# Patient Record
Sex: Female | Born: 1995 | Race: White | Hispanic: No | Marital: Single | Smoking: Current every day smoker
Health system: Southern US, Community
[De-identification: ages and names within clinical notes are randomized; demographics above are authoritative.]

## PROBLEM LIST (undated history)

## (undated) DIAGNOSIS — R55 Syncope and collapse: Secondary | ICD-10-CM

## (undated) DIAGNOSIS — G43909 Migraine, unspecified, not intractable, without status migrainosus: Secondary | ICD-10-CM

## (undated) DIAGNOSIS — R251 Tremor, unspecified: Secondary | ICD-10-CM

## (undated) DIAGNOSIS — K648 Other hemorrhoids: Secondary | ICD-10-CM

## (undated) DIAGNOSIS — J45909 Unspecified asthma, uncomplicated: Secondary | ICD-10-CM

## (undated) HISTORY — DX: Other hemorrhoids: K64.8

---
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
## 2017-07-10 ENCOUNTER — Emergency Department
Admission: EM | Admit: 2017-07-10 | Discharge: 2017-07-10 | Disposition: A | Discharge status: Home / Self Care | Payer: 59 | Attending: Emergency Medicine | Admitting: Emergency Medicine

## 2017-07-10 ENCOUNTER — Encounter: Payer: Self-pay | Admitting: Emergency Medicine

## 2017-07-10 DIAGNOSIS — J45909 Unspecified asthma, uncomplicated: Secondary | ICD-10-CM | POA: Insufficient documentation

## 2017-07-10 DIAGNOSIS — R42 Dizziness and giddiness: Secondary | ICD-10-CM | POA: Diagnosis not present

## 2017-07-10 DIAGNOSIS — E860 Dehydration: Secondary | ICD-10-CM | POA: Diagnosis not present

## 2017-07-10 DIAGNOSIS — N946 Dysmenorrhea, unspecified: Secondary | ICD-10-CM | POA: Insufficient documentation

## 2017-07-10 DIAGNOSIS — N938 Other specified abnormal uterine and vaginal bleeding: Secondary | ICD-10-CM | POA: Diagnosis present

## 2017-07-10 LAB — COMPREHENSIVE METABOLIC PANEL
ALBUMIN: 4.4 g/dL (ref 3.5–5.0)
ALT: 30 U/L (ref 14–54)
ANION GAP: 6 (ref 5–15)
AST: 33 U/L (ref 15–41)
Alkaline Phosphatase: 86 U/L (ref 38–126)
BILIRUBIN TOTAL: 0.5 mg/dL (ref 0.3–1.2)
BUN: 13 mg/dL (ref 6–20)
CO2: 22 mmol/L (ref 22–32)
Calcium: 9.4 mg/dL (ref 8.9–10.3)
Chloride: 109 mmol/L (ref 101–111)
Creatinine, Ser: 0.67 mg/dL (ref 0.44–1.00)
GFR calc non Af Amer: >60 mL/min (ref >60)
Glucose, Bld: 99 mg/dL (ref 65–99)
POTASSIUM: 4.4 mmol/L (ref 3.5–5.1)
SODIUM: 137 mmol/L (ref 135–145)
Total Protein: 7.5 g/dL (ref 6.5–8.1)

## 2017-07-10 LAB — CBC
HEMATOCRIT: 40.2 % (ref 35.0–47.0)
HEMOGLOBIN: 13.8 g/dL (ref 12.0–16.0)
MCH: 30.1 pg (ref 26.0–34.0)
MCHC: 34.2 g/dL (ref 32.0–36.0)
MCV: 87.9 fL (ref 80.0–100.0)
Platelets: 308 10*3/uL (ref 150–440)
RBC: 4.58 MIL/uL (ref 3.80–5.20)
RDW: 13.9 % (ref 11.5–14.5)
WBC: 7.4 10*3/uL (ref 3.6–11.0)

## 2017-07-10 LAB — LIPASE, BLOOD: Lipase: 23 U/L (ref 11–51)

## 2017-07-10 LAB — HCG, QUANTITATIVE, PREGNANCY: hCG, Beta Chain, Quant, S: <1 m[IU]/mL (ref <5)

## 2017-07-10 NOTE — ED Provider Notes (Signed)
[facility] Department Provider Note  ____________________________________________  Time seen: Approximately 12:57 PM  I have reviewed the triage vital signs and the nursing notes.   HISTORY  Chief Complaint Vaginal Bleeding; Dizziness; and Nausea    HPI Julia Brooks is a 21 y.o. female who comes to the ED complaining of dizziness nausea and generalized weakness. Reports that her period started 5 days ago which is normal time for her, and was heavy on the second and third day but now has improved and is pretty much stopped. 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 Denies any recent illness. No fevers chills or sweats. No cough urinary symptoms or significant pain currently.     Past Medical History:  Diagnosis Date  . Asthma   . Migraine   . Occasional tremors   . Syncope and collapse      Patient Active Problem List   Diagnosis Date Noted  . Near syncope 03/04/2014  . Headache 03/04/2014     No past surgical history on file.   Prior to Admission medications   Medication Sig Start Date End Date Taking? Authorizing Provider  albuterol (PROVENTIL HFA;VENTOLIN HFA) 108 (90 BASE) MCG/ACT inhaler Inhale 2 puffs into the lungs every 6 (six) hours as needed. For shortness of breath    [provider]  cyclobenzaprine (FLEXERIL) 5 MG tablet Take 1-2 tablets (5-10 mg total) by mouth 3 (three) times daily as needed for muscle spasms. 07/14/14   Pricilla LovelessGoldston, Scott, MD  ondansetron (ZOFRAN) 4 MG tablet Take 1 tablet (4 mg total) by mouth every 6 (six) hours. 12/11/12   Jethro Bastosrawford, Anne W, NP  SUMAtriptan (IMITREX) 50 MG tablet Take 50 mg by mouth every 2 (two) hours as needed. For migraine    [provider]  topiramate (TOPAMAX) 25 MG tablet Take 25 mg by mouth at bedtime.    [provider]      Allergies Patient has no known allergies.   No family history on file.  Social History Social History  Substance Use Topics  . Smoking status: Never Smoker  . Smokeless tobacco: Not on file  . Alcohol use No    Review of Systems  Constitutional:   No fever or chills.  ENT:   No sore throat. No rhinorrhea. Cardiovascular:   No chest pain or syncope. Respiratory:   No dyspnea or cough. Gastrointestinal:   Negative for abdominal pain, vomiting and diarrhea.  Musculoskeletal:   Negative for focal pain or swelling All other systems reviewed and are negative except as documented above in ROS and HPI.  ____________________________________________   PHYSICAL EXAM:  VITAL SIGNS: ED Triage Vitals [07/10/17 1039]  Enc Vitals Group     BP 133/78     Pulse Rate 84     Resp 18     Temp 98.4 F (36.9 C)     Temp Source Oral     SpO2 98 %     Weight 185 lb (83.9 kg)     Height 5\' 1"  (1.549 m)     Head Circumference      Peak Flow      Pain Score 5     Pain Loc      Pain Edu?      Excl. in GC?     Vital signs reviewed, nursing assessments reviewed.  Constitutional:   Alert and oriented. Well appearing and in no distress. Eyes:   No scleral icterus.  EOMI. No nystagmus. No conjunctival pallor. PERRL. ENT   Head:   Normocephalic and atraumatic.   Nose:   No congestion/rhinnorhea.    Mouth/Throat:   MMM, no pharyngeal erythema. No peritonsillar mass.    Neck:   No meningismus. Full ROM Hematological/Lymphatic/Immunilogical:   No cervical lymphadenopathy. Cardiovascular:   RRR. Symmetric bilateral radial and DP pulses.  No murmurs.  Respiratory:   Normal respiratory effort without tachypnea/retractions. Breath sounds are clear and equal bilaterally. No wheezes/rales/rhonchi. Gastrointestinal:   Soft and nontender. Non distended. There is no CVA tenderness.  No rebound, rigidity, or guarding. Genitourinary:   deferred Musculoskeletal:   Normal range of  motion in all extremities. No joint effusions.  No lower extremity tenderness.  No edema. Neurologic:   Normal speech and language.  Motor grossly intact. No gross focal neurologic deficits are appreciated.  Skin:    Skin is warm, dry and intact. No rash noted.  No petechiae, purpura, or bullae.  ____________________________________________    LABS (pertinent positives/negatives) (all labs ordered are listed, but only abnormal results are displayed) Labs Reviewed  HCG, QUANTITATIVE, PREGNANCY  LIPASE, BLOOD  COMPREHENSIVE METABOLIC PANEL  CBC  URINALYSIS, COMPLETE (UACMP) WITH MICROSCOPIC   ____________________________________________   EKG  Interpreted by me  Date: 07/10/2017  Rate: 88  Rhythm: normal sinus rhythm  QRS Axis: normal  Intervals: normal  ST/T Wave abnormalities: normal  Conduction Disutrbances: none  Narrative Interpretation: unremarkable      ____________________________________________    RADIOLOGY  No results found.  ____________________________________________   PROCEDURES Procedures  ____________________________________________   INITIAL IMPRESSION / ASSESSMENT AND PLAN / ED COURSE  Pertinent labs & imaging results that were available during my care of the patient were reviewed by me and considered in my medical decision making (see chart for details).  Patient well appearing no acute distress, presents with vague symptoms of likely dehydration, secondary to a have a menstrual cycle which is now resolving. Recommended oral hydration and salty snacks. Avoid warm environments for a couple of days until symptoms have resolved. Low suspicion for stroke meningitis encephalitis ACS PE dissection or pneumothorax or abdominal pathology such as cholecystitis pancreatitis perforation appendicitis bowel obstruction ectopic TOA torsion or PID. Follow-up with primary care. Patient does not currently have a doctor, I counseled her on the importance of  obtaining one for further health maintenance.      ____________________________________________   FINAL CLINICAL IMPRESSION(S) / ED DIAGNOSES  Final diagnoses:  Dehydration  Dizziness  Dysmenorrhea      New Prescriptions   No medications on file     Portions of this note were generated with dragon dictation software. Dictation errors may occur despite best attempts at proofreading.    Sharman CheekStafford, Ernestina Joe, MD 07/10/17 [phone]

## 2017-07-10 NOTE — ED Notes (Signed)
Pt alert and oriented X4, active, cooperative, pt in NAD. RR even and unlabored, color WNL.  Pt informed to return if any life threatening symptoms occur.   

## 2017-07-10 NOTE — ED Triage Notes (Signed)
Pt reports heavy menstrual bleeding that began Saturday. Pt reports associated dizziness, nausea and weakness. Pt reports initially saturating more than one pad an hour and soaking through her clothes. Pt reports bleeding continues but has slowed down considerably.

## 2017-12-03 ENCOUNTER — Encounter: Payer: Self-pay | Admitting: Emergency Medicine

## 2017-12-03 ENCOUNTER — Emergency Department: Payer: Worker's Compensation

## 2017-12-03 ENCOUNTER — Emergency Department
Admission: EM | Admit: 2017-12-03 | Discharge: 2017-12-03 | Disposition: A | Discharge status: Home / Self Care | Payer: Worker's Compensation | Attending: Emergency Medicine | Admitting: Emergency Medicine

## 2017-12-03 DIAGNOSIS — W268XXA Contact with other sharp object(s), not elsewhere classified, initial encounter: Secondary | ICD-10-CM | POA: Diagnosis not present

## 2017-12-03 DIAGNOSIS — Y93G1 Activity, food preparation and clean up: Secondary | ICD-10-CM | POA: Insufficient documentation

## 2017-12-03 DIAGNOSIS — S51811A Laceration without foreign body of right forearm, initial encounter: Secondary | ICD-10-CM | POA: Insufficient documentation

## 2017-12-03 DIAGNOSIS — S6991XA Unspecified injury of right wrist, hand and finger(s), initial encounter: Secondary | ICD-10-CM | POA: Diagnosis present

## 2017-12-03 DIAGNOSIS — Y92511 Restaurant or cafe as the place of occurrence of the external cause: Secondary | ICD-10-CM | POA: Diagnosis not present

## 2017-12-03 DIAGNOSIS — Y990 Civilian activity done for income or pay: Secondary | ICD-10-CM | POA: Insufficient documentation

## 2017-12-03 DIAGNOSIS — Z79899 Other long term (current) drug therapy: Secondary | ICD-10-CM | POA: Insufficient documentation

## 2017-12-03 DIAGNOSIS — S51812A Laceration without foreign body of left forearm, initial encounter: Secondary | ICD-10-CM | POA: Insufficient documentation

## 2017-12-03 DIAGNOSIS — J45909 Unspecified asthma, uncomplicated: Secondary | ICD-10-CM | POA: Insufficient documentation

## 2017-12-03 DIAGNOSIS — S61210A Laceration without foreign body of right index finger without damage to nail, initial encounter: Secondary | ICD-10-CM

## 2017-12-03 DIAGNOSIS — F17210 Nicotine dependence, cigarettes, uncomplicated: Secondary | ICD-10-CM | POA: Insufficient documentation

## 2017-12-03 MED ORDER — LIDOCAINE HCL (PF) 1 % IJ SOLN
2.0000 mL | Freq: Once | INTRAMUSCULAR | Status: AC
  Administered 2017-12-03: 2 mL
  Filled 2017-12-03: qty 5

## 2017-12-03 MED ORDER — CEPHALEXIN 500 MG PO CAPS: 500.0000 mg | ORAL_CAPSULE | Freq: Four times a day (QID) | ORAL | 0 refills | Status: AC

## 2017-12-03 MED ORDER — IBUPROFEN 800 MG PO TABS: 800.0000 mg | ORAL_TABLET | Freq: Three times a day (TID) | ORAL | 0 refills | Status: DC | PRN

## 2017-12-03 MED ORDER — IBUPROFEN 800 MG PO TABS
800.0000 mg | ORAL_TABLET | Freq: Once | ORAL | Status: AC
  Administered 2017-12-03: 800 mg via ORAL
  Filled 2017-12-03: qty 1

## 2017-12-03 NOTE — Discharge Instructions (Signed)
Please follow up with Dr. Ernest PineHooten in 7 days for skin check, suture removal and Steri-Strip application.  Please take antibiotics as prescribed.  You may take Tylenol and ibuprofen as needed for pain.  Return to the emergency department for any warmth erythema or drainage.  Please keep areas clean and dry.

## 2017-12-03 NOTE — ED Provider Notes (Signed)
[facility] EMERGENCY DEPARTMENT Provider Note   CSN: [id number] Arrival date & time: 12/03/17  2135     History   Chief Complaint Chief Complaint  Patient presents with  . Laceration    HPI Julia Brooks is a 21 y.o. female presents to the emergency department for evaluation of bilateral forearm cuts and right index finger laceration.  Injury occurred just prior to arrival.  Patient works at [employer], states she was carrying a stack of plates in the kitchen when she stumbled over a cart and fell.  Plates did shatter.  She suffered a skin tear to the volar aspect of both wrist as well as a volar laceration to the right index finger along the distal phalanx.  She denies any numbness or tingling.  Pain is moderate.  Tetanus is up-to-date.  She denies any other injury to her body during the fall.  No head trauma.  Patient states she fell to her knees but is not having any knee pain, swelling, catching or clicking.  She has been ambulatory since the accident with no pain.  HPI  Past Medical History:  Diagnosis Date  . Asthma   . Migraine   . Occasional tremors   . Syncope and collapse     Patient Active Problem List   Diagnosis Date Noted  . Near syncope 03/04/2014  . Headache 03/04/2014    History reviewed. No pertinent surgical history.  OB History    No data available       Home Medications    Prior to Admission medications   Medication Sig Start Date End Date Taking? Authorizing Provider  albuterol (PROVENTIL HFA;VENTOLIN HFA) 108 (90 BASE) MCG/ACT inhaler Inhale 2 puffs into the lungs every 6 (six) hours as needed. For shortness of breath    [provider]  cephALEXin (KEFLEX) 500 MG capsule Take 1 capsule (500 mg total) by mouth 4 (four) times daily for 7 days. 12/03/17 12/10/17  Evon SlackGaines, Henri Baumler C, PA-C  cyclobenzaprine (FLEXERIL) 5 MG tablet Take 1-2 tablets (5-10 mg total) by mouth 3 (three) times daily as needed for muscle  spasms. 07/14/14   Pricilla LovelessGoldston, Scott, MD  ibuprofen (ADVIL,MOTRIN) 800 MG tablet Take 1 tablet (800 mg total) by mouth every 8 (eight) hours as needed. 12/03/17   Evon SlackGaines, Adrie Picking C, PA-C  ondansetron (ZOFRAN) 4 MG tablet Take 1 tablet (4 mg total) by mouth every 6 (six) hours. 12/11/12   Jethro Bastosrawford, Anne W, NP  SUMAtriptan (IMITREX) 50 MG tablet Take 50 mg by mouth every 2 (two) hours as needed. For migraine    [provider]  topiramate (TOPAMAX) 25 MG tablet Take 25 mg by mouth at bedtime.    [provider]    Family History No family history on file.  Social History Social History   Tobacco Use  . Smoking status: Current Every Day Smoker    Packs/day: 0.50    Types: Cigarettes  . Smokeless tobacco: Never Used  Substance Use Topics  . Alcohol use: No  . Drug use: No     Allergies   Patient has no known allergies.   Review of Systems Review of Systems  Respiratory: Negative for shortness of breath.   Cardiovascular: Negative for chest pain.  Musculoskeletal: Positive for arthralgias. Negative for back pain, gait problem, joint swelling, myalgias and neck pain.  Skin: Positive for wound.  Neurological: Negative for numbness and headaches.     Physical Exam Updated Vital Signs BP Marland Kitchen(!)  151/91 (BP Location: Left Arm)   Pulse (!) 106   Temp 98.9 F (37.2 C) (Oral)   Resp 17   Ht 5\' 1"  (1.549 m)   Wt 79.4 kg (175 lb)   LMP 10/27/2017 (LMP Unknown)   SpO2 98%   BMI 33.07 kg/m   Physical Exam  Constitutional: She is oriented to person, place, and time. She appears well-developed and well-nourished.  HENT:  Head: Normocephalic and atraumatic.  Eyes: Conjunctivae are normal.  Neck: Normal range of motion.  Cardiovascular: Normal rate.  Pulmonary/Chest: Effort normal. No respiratory distress.  Musculoskeletal:  Examination of the upper extremities show patient has full range of motion with no discomfort.  She has full range of motion of bilateral lower  extremities with no discomfort.  She is able to stand and ambulate with no antalgic component.  Examination of right forearm shows a 0.5 cm skin tear along the volar aspect of the wrist, bleeding is well controlled.  Examination of the left forearm shows a 0.5 cm skin tear along the volar aspect of the wrist, bleeding is well controlled.  Examination of the right index finger shows 2 cm laceration, transverse along the volar aspect of the DIP joint.  She is able to actively flex the DIP joint, able to actively extend the DIP joint.  Sensation is intact distally.  There is no palpable or visible foreign body.  No nail trauma.  She has full active flexion and extension of the right index finger.  Neurological: She is alert and oriented to person, place, and time.  Skin: Skin is warm.  Psychiatric: She has a normal mood and affect. Her behavior is normal.     ED Treatments / Results  Labs (all labs ordered are listed, but only abnormal results are displayed) Labs Reviewed - No data to display  EKG  EKG Interpretation None       Radiology Dg Hand Complete Right  Result Date: 12/03/2017 CLINICAL DATA:  Right index finger laceration. Fall. Tripped over a tray on the floor while walking with dishes at work. EXAM: RIGHT HAND - COMPLETE 3+ VIEW COMPARISON:  None. FINDINGS: Site of laceration is not well seen radiographically. No soft tissue air or radiopaque foreign body. There is no evidence of fracture or dislocation. There is no evidence of arthropathy or other focal bone abnormality. Soft tissues are unremarkable. IMPRESSION: Site of laceration not well seen radiographically. No radiopaque foreign body or osseous abnormality. Electronically Signed   By: Rubye OaksMelanie  Ehinger M.D.   On: 12/03/2017 23:16    Procedures .Marland Kitchen.Laceration Repair Date/Time: 12/03/2017 11:38 PM Performed by: Evon SlackGaines, Benedetto Ryder C, PA-C Authorized by: Evon SlackGaines, Chrisann Melaragno C, PA-C   Consent:    Consent obtained:  Verbal   Consent  given by:  Patient   Risks discussed:  Infection, pain and poor wound healing   Alternatives discussed:  No treatment Anesthesia (see MAR for exact dosages):    Anesthesia method:  Nerve block   Block needle gauge:  27 G   Block anesthetic:  Lidocaine 1% w/o epi   Block injection procedure:  Anatomic landmarks identified   Block outcome:  Anesthesia achieved Laceration details:    Location:  Finger   Finger location:  R index finger   Length (cm):  2   Depth (mm):  3 Repair type:    Repair type:  Simple Pre-procedure details:    Preparation:  Patient was prepped and draped in usual sterile fashion and imaging obtained to evaluate for  foreign bodies Exploration:    Wound exploration: wound explored through full range of motion and entire depth of wound probed and visualized     Contaminated: no   Treatment:    Area cleansed with:  Betadine and saline   Amount of cleaning:  Standard   Irrigation solution:  Sterile saline   Irrigation method:  Pressure wash   Visualized foreign bodies/material removed: no   Skin repair:    Repair method:  Sutures   Suture size:  5-0   Suture material:  Nylon   Suture technique:  Simple interrupted   Number of sutures:  5 Approximation:    Approximation:  Close   Vermilion border: well-aligned   Post-procedure details:    Dressing:  Adhesive bandage   Patient tolerance of procedure:  Tolerated well, no immediate complications   (including critical care time)  Medications Ordered in ED Medications  ibuprofen (ADVIL,MOTRIN) tablet 800 mg (800 mg Oral Given 12/03/17 2246)  lidocaine (PF) (XYLOCAINE) 1 % injection 2 mL (2 mLs Infiltration Given 12/03/17 2247)     Initial Impression / Assessment and Plan / ED Course  I have reviewed the triage vital signs and the nursing notes.  Pertinent labs & imaging results that were available during my care of the patient were reviewed by me and considered in my medical decision making (see chart for  details).     21 year old female with workers comp injury just prior to arrival.  She suffered small skin tears to the volar aspect of both wrist.  Bilateral skin tears on the forearm were cleansed with saline and Betadine, no visible or palpable foreign body, Dermabond applied.  Right index finger laceration showed no neurological or tendon deficits.  Right index finger was thoroughly irrigated and cleansed and sutures applied.  X-ray showed no evidence of foreign body or acute bony abnormality.  Patient will follow-up with orthopedics in 1 week for suture removal and skin check.  She is placed on prophylactic antibiotics.  She is educated on signs and symptoms return to clinic for.  Final Clinical Impressions(s) / ED Diagnoses   Final diagnoses:  Laceration of right index finger without foreign body without damage to nail, initial encounter  Skin tear of left forearm without complication, initial encounter  Skin tear of forearm without complication, right, initial encounter    ED Discharge Orders        Ordered    ibuprofen (ADVIL,MOTRIN) 800 MG tablet  Every 8 hours PRN     12/03/17 2321    cephALEXin (KEFLEX) 500 MG capsule  4 times daily     12/03/17 2321       Ronnette Juniper 12/03/17 2342    Loleta Rose, MD 12/03/17 [phone]

## 2017-12-03 NOTE — ED Notes (Signed)

## 2017-12-03 NOTE — ED Triage Notes (Signed)
Pt comes into the ED via POV c/o laceration to the right index finger and small lacerations to bilateral wrist.  Patient was walking holding dishes at work when she tripped over a tray on the floor.  Patient has all bleeding under control at this time and in NAD.  Patient will be filing as workers comp.

## 2017-12-03 NOTE — ED Notes (Signed)
Worker's Comp profile denies needing urine/breathalizer or drug screen.

## 2019-07-21 DIAGNOSIS — G4700 Insomnia, unspecified: Secondary | ICD-10-CM | POA: Diagnosis not present

## 2019-07-21 DIAGNOSIS — F331 Major depressive disorder, recurrent, moderate: Secondary | ICD-10-CM | POA: Diagnosis not present

## 2019-07-21 DIAGNOSIS — K219 Gastro-esophageal reflux disease without esophagitis: Secondary | ICD-10-CM | POA: Diagnosis not present

## 2019-07-21 DIAGNOSIS — F411 Generalized anxiety disorder: Secondary | ICD-10-CM | POA: Diagnosis not present

## 2019-09-01 DIAGNOSIS — F411 Generalized anxiety disorder: Secondary | ICD-10-CM | POA: Diagnosis not present

## 2019-09-01 DIAGNOSIS — F331 Major depressive disorder, recurrent, moderate: Secondary | ICD-10-CM | POA: Diagnosis not present

## 2019-09-01 DIAGNOSIS — K219 Gastro-esophageal reflux disease without esophagitis: Secondary | ICD-10-CM | POA: Diagnosis not present

## 2019-09-01 DIAGNOSIS — G4700 Insomnia, unspecified: Secondary | ICD-10-CM | POA: Diagnosis not present

## 2020-01-08 DIAGNOSIS — Z20828 Contact with and (suspected) exposure to other viral communicable diseases: Secondary | ICD-10-CM | POA: Diagnosis not present

## 2020-09-09 ENCOUNTER — Ambulatory Visit: Admit: 2020-09-09 | Discharge status: Absent without leave | Payer: Self-pay

## 2020-11-14 DIAGNOSIS — Z1152 Encounter for screening for COVID-19: Secondary | ICD-10-CM | POA: Diagnosis not present

## 2020-11-14 DIAGNOSIS — Z03818 Encounter for observation for suspected exposure to other biological agents ruled out: Secondary | ICD-10-CM | POA: Diagnosis not present

## 2021-02-02 ENCOUNTER — Emergency Department
Admission: EM | Admit: 2021-02-02 | Discharge: 2021-02-02 | Disposition: A | Discharge status: Home / Self Care | Payer: BC Managed Care – PPO | Attending: Emergency Medicine | Admitting: Emergency Medicine

## 2021-02-02 ENCOUNTER — Emergency Department: Payer: BC Managed Care – PPO

## 2021-02-02 ENCOUNTER — Other Ambulatory Visit: Payer: Self-pay

## 2021-02-02 ENCOUNTER — Encounter: Payer: Self-pay | Admitting: Emergency Medicine

## 2021-02-02 DIAGNOSIS — J45909 Unspecified asthma, uncomplicated: Secondary | ICD-10-CM | POA: Diagnosis not present

## 2021-02-02 DIAGNOSIS — R55 Syncope and collapse: Secondary | ICD-10-CM | POA: Diagnosis not present

## 2021-02-02 DIAGNOSIS — M542 Cervicalgia: Secondary | ICD-10-CM | POA: Diagnosis not present

## 2021-02-02 DIAGNOSIS — F17210 Nicotine dependence, cigarettes, uncomplicated: Secondary | ICD-10-CM | POA: Insufficient documentation

## 2021-02-02 DIAGNOSIS — R519 Headache, unspecified: Secondary | ICD-10-CM | POA: Insufficient documentation

## 2021-02-02 LAB — CBC
HCT: 38.8 % (ref 36.0–46.0)
Hemoglobin: 13.2 g/dL (ref 12.0–15.0)
MCH: 30.6 pg (ref 26.0–34.0)
MCHC: 34 g/dL (ref 30.0–36.0)
MCV: 89.8 fL (ref 80.0–100.0)
Platelets: 285 10*3/uL (ref 150–400)
RBC: 4.32 MIL/uL (ref 3.87–5.11)
RDW: 12.7 % (ref 11.5–15.5)
WBC: 4.4 10*3/uL (ref 4.0–10.5)
nRBC: 0 % (ref 0.0–0.2)

## 2021-02-02 LAB — URINALYSIS, COMPLETE (UACMP) WITH MICROSCOPIC
Bacteria, UA: NONE SEEN
Bilirubin Urine: NEGATIVE
Glucose, UA: NEGATIVE mg/dL
Ketones, ur: NEGATIVE mg/dL
Leukocytes,Ua: NEGATIVE
Nitrite: NEGATIVE
Protein, ur: NEGATIVE mg/dL
Specific Gravity, Urine: 1.024 (ref 1.005–1.030)
pH: 5 (ref 5.0–8.0)

## 2021-02-02 LAB — BASIC METABOLIC PANEL
Anion gap: 7 (ref 5–15)
BUN: 8 mg/dL (ref 6–20)
CO2: 21 mmol/L — ABNORMAL LOW (ref 22–32)
Calcium: 8.7 mg/dL — ABNORMAL LOW (ref 8.9–10.3)
Chloride: 107 mmol/L (ref 98–111)
Creatinine, Ser: 0.68 mg/dL (ref 0.44–1.00)
GFR, Estimated: >60 mL/min (ref >60)
Glucose, Bld: 153 mg/dL — ABNORMAL HIGH (ref 70–99)
Potassium: 3.3 mmol/L — ABNORMAL LOW (ref 3.5–5.1)
Sodium: 135 mmol/L (ref 135–145)

## 2021-02-02 LAB — POC URINE PREG, ED: Preg Test, Ur: NEGATIVE

## 2021-02-02 NOTE — ED Provider Notes (Signed)
[facility] Emergency Department Provider Note  ____________________________________________  Time seen: Approximately 2:53 PM  I have reviewed the triage vital signs and the nursing notes.   HISTORY  Chief Complaint Loss of Consciousness    HPI Julia Brooks is a 25 y.o. female with a history of migraines, syncope who comes ED complaining of a syncope episode at work today.  This is happened 2 times before, once 7 years ago, once 5 months ago, and then today.  No prodromal symptoms, was feeling normal prior to the episode which seem to happen without warning.  Patient was in her chair and fell to the floor hitting her head.  Has some headache of the left frontal scalp where she hit, denies neck pain, denies vision changes vomiting paresthesias or weakness.  No other symptoms.  Has been in usual state of health recently.      Past Medical History:  Diagnosis Date  . Asthma   . Migraine   . Occasional tremors   . Syncope and collapse      Patient Active Problem List   Diagnosis Date Noted  . Near syncope 03/04/2014  . Headache 03/04/2014     History reviewed. No pertinent surgical history.   Prior to Admission medications   Medication Sig Start Date End Date Taking? Authorizing Provider  albuterol (PROVENTIL HFA;VENTOLIN HFA) 108 (90 BASE) MCG/ACT inhaler Inhale 2 puffs into the lungs every 6 (six) hours as needed. For shortness of breath    [provider]  cyclobenzaprine (FLEXERIL) 5 MG tablet Take 1-2 tablets (5-10 mg total) by mouth 3 (three) times daily as needed for muscle spasms. 07/14/14   Pricilla LovelessGoldston, Scott, MD  ibuprofen (ADVIL,MOTRIN) 800 MG tablet Take 1 tablet (800 mg total) by mouth every 8 (eight) hours as needed. 12/03/17   Evon SlackGaines, Thomas C, PA-C  ondansetron (ZOFRAN) 4 MG tablet Take 1 tablet (4 mg total) by mouth every 6 (six) hours. 12/11/12   Jethro Bastosrawford, Anne W, NP  SUMAtriptan (IMITREX) 50 MG tablet Take 50 mg by mouth  every 2 (two) hours as needed. For migraine    [provider]  topiramate (TOPAMAX) 25 MG tablet Take 25 mg by mouth at bedtime.    [provider]     Allergies Patient has no known allergies.   History reviewed. No pertinent family history.  Social History Social History   Tobacco Use  . Smoking status: Current Every Day Smoker    Packs/day: 0.50    Types: Cigarettes  . Smokeless tobacco: Never Used  Substance Use Topics  . Alcohol use: No  . Drug use: No    Review of Systems  Constitutional:   No fever or chills.  ENT:   No sore throat. No rhinorrhea. Cardiovascular:   No chest pain or syncope. Respiratory:   No dyspnea or cough. Gastrointestinal:   Negative for abdominal pain, vomiting and diarrhea.  Musculoskeletal:   Negative for focal pain or swelling All other systems reviewed and are negative except as documented above in ROS and HPI.  ____________________________________________   PHYSICAL EXAM:  VITAL SIGNS: ED Triage Vitals  Enc Vitals Group     BP 02/02/21 1115 132/73     Pulse Rate 02/02/21 1115 79     Resp 02/02/21 1200 17     Temp 02/02/21 1115 98.1 F (36.7 C)     Temp Source 02/02/21 1115 Oral     SpO2 02/02/21 1200 99 %     Weight  02/02/21 1106 185 lb (83.9 kg)     Height 02/02/21 1106 5\' 1"  (1.549 m)     Head Circumference --      Peak Flow --      Pain Score 02/02/21 1106 5     Pain Loc --      Pain Edu? --      Excl. in GC? --     Vital signs reviewed, nursing assessments reviewed.   Constitutional:   Alert and oriented. Non-toxic appearance. Eyes:   Conjunctivae are normal. EOMI. PERRL. ENT      Head:   Normocephalic with small scalp contusion on the left forehead.      Nose:   Normal no epistaxis      Mouth/Throat:   No intraoral injury      Neck:   No meningismus. Full ROM.  There is midline C-spine tenderness without step-off or instability Hematological/Lymphatic/Immunilogical:   No cervical  lymphadenopathy. Cardiovascular:   RRR. Symmetric bilateral radial and DP pulses.  No murmurs. Cap refill less than 2 seconds. Respiratory:   Normal respiratory effort without tachypnea/retractions. Breath sounds are clear and equal bilaterally. No wheezes/rales/rhonchi.  Musculoskeletal:   Normal range of motion in all extremities. No joint effusions.  No lower extremity tenderness.  No edema. Neurologic:   Normal speech and language.  Motor grossly intact. No acute focal neurologic deficits are appreciated.  Skin:    Skin is warm, dry and intact. No rash noted.  No petechiae, purpura, or bullae.  ____________________________________________    LABS (pertinent positives/negatives) (all labs ordered are listed, but only abnormal results are displayed) Labs Reviewed  BASIC METABOLIC PANEL - Abnormal; Notable for the following components:      Result Value   Potassium 3.3 (*)    CO2 21 (*)    Glucose, Bld 153 (*)    Calcium 8.7 (*)    All other components within normal limits  URINALYSIS, COMPLETE (UACMP) WITH MICROSCOPIC - Abnormal; Notable for the following components:   Color, Urine YELLOW (*)    APPearance HAZY (*)    Hgb urine dipstick MODERATE (*)    All other components within normal limits  CBC  CBG MONITORING, ED  POC URINE PREG, ED   ____________________________________________   EKG  Interpreted by me Normal sinus rhythm rate of 82, normal axis and intervals.  Normal QRS ST segments and T waves.  ____________________________________________    RADIOLOGY  CT HEAD WO CONTRAST  Result Date: 02/02/2021 CLINICAL DATA:  Facial trauma. Neck trauma, midline tenderness. Additional history provided: Patient reports fall at work resulting in occipital and posterior neck pain, frontal headache. EXAM: CT HEAD WITHOUT CONTRAST CT CERVICAL SPINE WITHOUT CONTRAST TECHNIQUE: Multidetector CT imaging of the head and cervical spine was performed following the standard protocol  without intravenous contrast. Multiplanar CT image reconstructions of the cervical spine were also generated. COMPARISON:  No pertinent prior exams available for comparison. FINDINGS: CT HEAD FINDINGS Brain: Cerebral volume is normal. There is no acute intracranial hemorrhage. No demarcated cortical infarct. No extra-axial fluid collection. No evidence of intracranial mass. No midline shift. Vascular: No hyperdense vessel. Skull: Normal. Negative for fracture or focal lesion. Sinuses/Orbits: Visualized orbits show no acute finding. No significant paranasal sinus disease at the imaged levels. CT CERVICAL SPINE FINDINGS Alignment: Mild nonspecific reversal of the expected cervical lordosis. Mild cervical dextrocurvature no significant spondylolisthesis. Skull base and vertebrae: The basion-dental and atlanto-dental intervals are maintained.No evidence of acute fracture to the cervical spine.  Small well corticated bony fragment along the posterior aspect of the right C1 lateral mass (series 4, image 20), which may be congenital or may reflect a chronic fracture. Soft tissues and spinal canal: No prevertebral fluid or swelling. No visible canal hematoma. Disc levels: No significant bony spinal canal or neural foraminal narrowing at any level. Upper chest: No consolidation within the imaged lung apices. No visible pneumothorax IMPRESSION: CT head: No evidence of acute intracranial abnormality. CT cervical spine: 1. No evidence of acute fracture to the cervical spine. 2. Small well-corticated bony fragment along the posterior aspect of the right C1 lateral mass, which may be congenital or may reflect a chronic fracture. 3. Nonspecific reversal of the expected cervical lordosis. 4. Mild cervical dextrocurvature. Electronically Signed   By: Jackey Loge DO   On: 02/02/2021 12:55   CT Cervical Spine Wo Contrast  Result Date: 02/02/2021 CLINICAL DATA:  Facial trauma. Neck trauma, midline tenderness. Additional history  provided: Patient reports fall at work resulting in occipital and posterior neck pain, frontal headache. EXAM: CT HEAD WITHOUT CONTRAST CT CERVICAL SPINE WITHOUT CONTRAST TECHNIQUE: Multidetector CT imaging of the head and cervical spine was performed following the standard protocol without intravenous contrast. Multiplanar CT image reconstructions of the cervical spine were also generated. COMPARISON:  No pertinent prior exams available for comparison. FINDINGS: CT HEAD FINDINGS Brain: Cerebral volume is normal. There is no acute intracranial hemorrhage. No demarcated cortical infarct. No extra-axial fluid collection. No evidence of intracranial mass. No midline shift. Vascular: No hyperdense vessel. Skull: Normal. Negative for fracture or focal lesion. Sinuses/Orbits: Visualized orbits show no acute finding. No significant paranasal sinus disease at the imaged levels. CT CERVICAL SPINE FINDINGS Alignment: Mild nonspecific reversal of the expected cervical lordosis. Mild cervical dextrocurvature no significant spondylolisthesis. Skull base and vertebrae: The basion-dental and atlanto-dental intervals are maintained.No evidence of acute fracture to the cervical spine. Small well corticated bony fragment along the posterior aspect of the right C1 lateral mass (series 4, image 20), which may be congenital or may reflect a chronic fracture. Soft tissues and spinal canal: No prevertebral fluid or swelling. No visible canal hematoma. Disc levels: No significant bony spinal canal or neural foraminal narrowing at any level. Upper chest: No consolidation within the imaged lung apices. No visible pneumothorax IMPRESSION: CT head: No evidence of acute intracranial abnormality. CT cervical spine: 1. No evidence of acute fracture to the cervical spine. 2. Small well-corticated bony fragment along the posterior aspect of the right C1 lateral mass, which may be congenital or may reflect a chronic fracture. 3. Nonspecific reversal  of the expected cervical lordosis. 4. Mild cervical dextrocurvature. Electronically Signed   By: Jackey Loge DO   On: 02/02/2021 12:55    ____________________________________________   PROCEDURES Procedures  ____________________________________________  DIFFERENTIAL DIAGNOSIS   Intracranial hemorrhage, C-spine fracture, electrolyte abnormality, dehydration  CLINICAL IMPRESSION / ASSESSMENT AND PLAN / ED COURSE  Medications ordered in the ED: Medications - No data to display  Pertinent labs & imaging results that were available during my care of the patient were reviewed by me and considered in my medical decision making (see chart for details).  RYELLE RUVALCABA was evaluated in Emergency Department on 02/02/2021 for the symptoms described in the history of present illness. She was evaluated in the context of the global COVID-19 pandemic, which necessitated consideration that the patient might be at risk for infection with the SARS-CoV-2 virus that causes COVID-19. Institutional protocols and algorithms that pertain  to the evaluation of patients at risk for COVID-19 are in a state of rapid change based on information released by regulatory bodies including the CDC and federal and state organizations. These policies and algorithms were followed during the patient's care in the ED.   Patient presents with syncope while at work.  Has past history of mysterious syncope episodes that are previously been evaluated by pediatric cardiology and pediatric neurology at [facility].  No specific findings at that time, reviewed EMR, attributed to vasovagal episodes by cardiology.  CT head and C-spine unremarkable, C-spine cleared clinically after imaging.  Labs unremarkable.  Patient notes very stressful work environment, working 60 to 70 hours a week as a [employer], always busy and hectic.  Recommend establishing routines, regular wake-up and bedtime, regular sleep, good hydration and  regular meals, increase mindfulness for possible prodromal symptoms.  Seizure precautions discussed, recommended following back up with neurology for further assessment.      ____________________________________________   FINAL CLINICAL IMPRESSION(S) / ED DIAGNOSES    Final diagnoses:  Syncope, unspecified syncope type     ED Discharge Orders    None      Portions of this note were generated with dragon dictation software. Dictation errors may occur despite best attempts at proofreading.   Sharman Cheek, MD 02/02/21 [phone]

## 2021-02-02 NOTE — ED Triage Notes (Signed)
Pt comes into the ED via POV c/o syncopal episode today while at work.  Pt states that she fell out of her work chair and hit her head on the floor per her employer.  Pt is neurologically intact at this time and able to answer all questions.  Pt in NAD with even and unlabored respirations.  Pt has had a h/o syncopal episodes in the past with unknown reasons why.

## 2021-02-09 DIAGNOSIS — E612 Magnesium deficiency: Secondary | ICD-10-CM | POA: Diagnosis not present

## 2021-02-09 DIAGNOSIS — E559 Vitamin D deficiency, unspecified: Secondary | ICD-10-CM | POA: Diagnosis not present

## 2021-02-09 DIAGNOSIS — Z79899 Other long term (current) drug therapy: Secondary | ICD-10-CM | POA: Diagnosis not present

## 2021-02-09 DIAGNOSIS — E538 Deficiency of other specified B group vitamins: Secondary | ICD-10-CM | POA: Diagnosis not present

## 2021-02-09 DIAGNOSIS — R569 Unspecified convulsions: Secondary | ICD-10-CM | POA: Diagnosis not present

## 2021-02-13 ENCOUNTER — Other Ambulatory Visit: Payer: Self-pay | Admitting: Neurology

## 2021-02-13 DIAGNOSIS — R569 Unspecified convulsions: Secondary | ICD-10-CM

## 2021-02-16 ENCOUNTER — Other Ambulatory Visit: Payer: Self-pay

## 2021-02-16 ENCOUNTER — Ambulatory Visit
Admission: RE | Admit: 2021-02-16 | Discharge: 2021-02-16 | Disposition: A | Discharge status: Home / Self Care | Payer: BC Managed Care – PPO | Source: Ambulatory Visit | Attending: Neurology | Admitting: Neurology

## 2021-02-16 DIAGNOSIS — R569 Unspecified convulsions: Secondary | ICD-10-CM | POA: Diagnosis not present

## 2021-02-16 MED ORDER — GADOBUTROL 1 MMOL/ML IV SOLN
7.5000 mL | Freq: Once | INTRAVENOUS | Status: AC | PRN
  Administered 2021-02-16: 7.5 mL via INTRAVENOUS

## 2021-03-22 DIAGNOSIS — R569 Unspecified convulsions: Secondary | ICD-10-CM | POA: Diagnosis not present

## 2021-04-21 ENCOUNTER — Other Ambulatory Visit: Payer: Self-pay

## 2021-04-21 ENCOUNTER — Emergency Department
Admission: EM | Admit: 2021-04-21 | Discharge: 2021-04-21 | Disposition: A | Discharge status: Home / Self Care | Payer: BC Managed Care – PPO | Attending: Emergency Medicine | Admitting: Emergency Medicine

## 2021-04-21 ENCOUNTER — Emergency Department: Payer: BC Managed Care – PPO

## 2021-04-21 DIAGNOSIS — J45909 Unspecified asthma, uncomplicated: Secondary | ICD-10-CM | POA: Diagnosis not present

## 2021-04-21 DIAGNOSIS — R519 Headache, unspecified: Secondary | ICD-10-CM | POA: Diagnosis not present

## 2021-04-21 DIAGNOSIS — R569 Unspecified convulsions: Secondary | ICD-10-CM | POA: Insufficient documentation

## 2021-04-21 DIAGNOSIS — S0083XA Contusion of other part of head, initial encounter: Secondary | ICD-10-CM | POA: Diagnosis not present

## 2021-04-21 DIAGNOSIS — F17210 Nicotine dependence, cigarettes, uncomplicated: Secondary | ICD-10-CM | POA: Insufficient documentation

## 2021-04-21 DIAGNOSIS — G40909 Epilepsy, unspecified, not intractable, without status epilepticus: Secondary | ICD-10-CM | POA: Diagnosis not present

## 2021-04-21 LAB — BASIC METABOLIC PANEL
Anion gap: 6 (ref 5–15)
BUN: 15 mg/dL (ref 6–20)
CO2: 21 mmol/L — ABNORMAL LOW (ref 22–32)
Calcium: 9.2 mg/dL (ref 8.9–10.3)
Chloride: 108 mmol/L (ref 98–111)
Creatinine, Ser: 0.5 mg/dL (ref 0.44–1.00)
GFR, Estimated: >60 mL/min (ref >60)
Glucose, Bld: 101 mg/dL — ABNORMAL HIGH (ref 70–99)
Potassium: 3.8 mmol/L (ref 3.5–5.1)
Sodium: 135 mmol/L (ref 135–145)

## 2021-04-21 LAB — HEPATIC FUNCTION PANEL
ALT: 18 U/L (ref 0–44)
AST: 24 U/L (ref 15–41)
Albumin: 4.1 g/dL (ref 3.5–5.0)
Alkaline Phosphatase: 57 U/L (ref 38–126)
Bilirubin, Direct: <0.1 mg/dL (ref 0.0–0.2)
Total Bilirubin: 0.6 mg/dL (ref 0.3–1.2)
Total Protein: 7.5 g/dL (ref 6.5–8.1)

## 2021-04-21 LAB — CBC
HCT: 40.8 % (ref 36.0–46.0)
Hemoglobin: 14 g/dL (ref 12.0–15.0)
MCH: 30.8 pg (ref 26.0–34.0)
MCHC: 34.3 g/dL (ref 30.0–36.0)
MCV: 89.9 fL (ref 80.0–100.0)
Platelets: 300 10*3/uL (ref 150–400)
RBC: 4.54 MIL/uL (ref 3.87–5.11)
RDW: 12 % (ref 11.5–15.5)
WBC: 5 10*3/uL (ref 4.0–10.5)
nRBC: 0 % (ref 0.0–0.2)

## 2021-04-21 LAB — CK: Total CK: 113 U/L (ref 38–234)

## 2021-04-21 MED ORDER — LORAZEPAM 1 MG PO TABS
1.0000 mg | ORAL_TABLET | Freq: Once | ORAL | Status: AC
  Administered 2021-04-21: 1 mg via ORAL
  Filled 2021-04-21: qty 1

## 2021-04-21 MED ORDER — LAMOTRIGINE ER 50 MG PO TB24: 2.0000 | ORAL_TABLET | Freq: Two times a day (BID) | ORAL | 0 refills | Status: DC

## 2021-04-21 NOTE — ED Notes (Signed)
Pt leaving for CT. Will place IV and collect red tube for lab once pt back to room. Visitor at bedside.

## 2021-04-21 NOTE — ED Notes (Addendum)
See triage note. Pt reports a staff member from work had to pick her up off the ground after incident which was unwitnessed by co-workers. Pt reports 7/10 throbbing HA with facial pain. Reports initial blurred vision but currently baseline vision. A&Ox4. Denies nausea currently. Pt given warm blanket and room temp inc.

## 2021-04-21 NOTE — Discharge Instructions (Signed)
We are going to increase your Lamictal from 50 mg twice a day to 100 mg twice a day.    You currently are taking 50 mg capsules twice a day.  Increase this to 2 capsules twice a day.  Call Dr. Clelia Croft.  I have prescribed additional doses of 50 mg so you do not run out before seeing Dr. Sherryll Burger.  You only need to take a total of 100 mg twice a day.

## 2021-04-21 NOTE — ED Triage Notes (Signed)
Pt states she is under investigation with Dr Clelia Croft for seizure disorder- pt states she thinks she had a seizure today- pt states she was at her computer at work and then she woke up on the floor- pt states she hit her head and has swelling to the face

## 2021-04-21 NOTE — ED Notes (Addendum)
EDP Isaacs at bedside. Pt voice clear. Skin dry. Resp reg/unlabored.

## 2021-04-21 NOTE — ED Provider Notes (Signed)
[facility] Emergency Department Provider Note  ____________________________________________   Event Date/Time   First MD Initiated Contact with Patient 04/21/21 1614     (approximate)  I have reviewed the triage vital signs and the nursing notes.   HISTORY  Chief Complaint Seizures    HPI Olegario ShearerCourtney D Mahabir is a 25 y.o. female  Here with seizure like episode. Pt reports that she was at work earlier today when she acutely lost consciousness. She reportedly was found on the ground with a witnessed GTC seizure like activity by her grill cook. Works as a Production designer, theatre/television/filmmanager at [employer]. She felt "hot" and "out of it." Reports she had no prodrome, felt fine going into work this AM. No headache. She c/o aching, throbbing left facial pain and HA today after the episode, which she believes she hit on the ground. No tongue lacerations. H/o recent episodes x 3 and is on Lamictal from Dr. Sherryll BurgerShah. Had an EEG several weeks ago but does not have the result yet. No other complaints.        Past Medical History:  Diagnosis Date  . Asthma   . Migraine   . Occasional tremors   . Syncope and collapse     Patient Active Problem List   Diagnosis Date Noted  . Near syncope 03/04/2014  . Headache 03/04/2014    History reviewed. No pertinent surgical history.  Prior to Admission medications   Medication Sig Start Date End Date Taking? Authorizing Provider  LamoTRIgine 50 MG TB24 24 hour tablet Take 2 tablets (100 mg total) by mouth in the morning and at bedtime. Take 100 mg total twice a day. If you already have the 50 mg capsules at home, take two capsules twice daily until out, then take this medication. DO NOT take both forms of lamictal. 04/21/21 05/21/21 Yes Shaune PollackIsaacs, Rudi Bunyard, MD  albuterol (PROVENTIL HFA;VENTOLIN HFA) 108 (90 BASE) MCG/ACT inhaler Inhale 2 puffs into the lungs every 6 (six) hours as needed. For shortness of breath    [provider]  cyclobenzaprine  (FLEXERIL) 5 MG tablet Take 1-2 tablets (5-10 mg total) by mouth 3 (three) times daily as needed for muscle spasms. 07/14/14   Pricilla LovelessGoldston, Scott, MD  ibuprofen (ADVIL,MOTRIN) 800 MG tablet Take 1 tablet (800 mg total) by mouth every 8 (eight) hours as needed. 12/03/17   Evon SlackGaines, Thomas C, PA-C  ondansetron (ZOFRAN) 4 MG tablet Take 1 tablet (4 mg total) by mouth every 6 (six) hours. 12/11/12   Jethro Bastosrawford, Anne W, NP  SUMAtriptan (IMITREX) 50 MG tablet Take 50 mg by mouth every 2 (two) hours as needed. For migraine    [provider]  topiramate (TOPAMAX) 25 MG tablet Take 25 mg by mouth at bedtime.    [provider]    Allergies Patient has no known allergies.  History reviewed. No pertinent family history.  Social History Social History   Tobacco Use  . Smoking status: Current Every Day Smoker    Packs/day: 0.50    Types: Cigarettes  . Smokeless tobacco: Never Used  Substance Use Topics  . Alcohol use: No  . Drug use: No    Review of Systems  Review of Systems  Constitutional: Negative for fatigue and fever.  HENT: Positive for facial swelling. Negative for congestion and sore throat.   Eyes: Negative for visual disturbance.  Respiratory: Negative for cough and shortness of breath.   Cardiovascular: Negative for chest pain.  Gastrointestinal: Negative for abdominal pain, diarrhea,  nausea and vomiting.  Genitourinary: Negative for flank pain.  Musculoskeletal: Negative for back pain and neck pain.  Skin: Negative for rash and wound.  Neurological: Positive for seizures. Negative for weakness.  All other systems reviewed and are negative.    ____________________________________________  PHYSICAL EXAM:      VITAL SIGNS: ED Triage Vitals  Enc Vitals Group     BP 04/21/21 1337 108/82     Pulse Rate 04/21/21 1337 78     Resp 04/21/21 1337 18     Temp 04/21/21 1337 98.4 F (36.9 C)     Temp Source 04/21/21 1337 Oral     SpO2 04/21/21 1337 97 %     Weight  04/21/21 1338 165 lb (74.8 kg)     Height 04/21/21 1338 5\' 1"  (1.549 m)     Head Circumference --      Peak Flow --      Pain Score 04/21/21 1338 7     Pain Loc --      Pain Edu? --      Excl. in GC? --      Physical Exam Vitals and nursing note reviewed.  Constitutional:      General: She is not in acute distress.    Appearance: She is well-developed.  HENT:     Head: Normocephalic and atraumatic.     Comments: Moderate swelling and tenderness along left maxillary cheek, periorbital area. No tongue lacerations. No open wounds. Eyes:     Conjunctiva/sclera: Conjunctivae normal.  Cardiovascular:     Rate and Rhythm: Normal rate and regular rhythm.     Heart sounds: Normal heart sounds. No murmur heard. No friction rub.  Pulmonary:     Effort: Pulmonary effort is normal. No respiratory distress.     Breath sounds: Normal breath sounds. No wheezing or rales.  Abdominal:     General: There is no distension.     Palpations: Abdomen is soft.     Tenderness: There is no abdominal tenderness.  Musculoskeletal:     Cervical back: Neck supple.  Skin:    General: Skin is warm.     Capillary Refill: Capillary refill takes less than 2 seconds.  Neurological:     Mental Status: She is alert and oriented to person, place, and time.     GCS: GCS eye subscore is 4. GCS verbal subscore is 5. GCS motor subscore is 6.     Cranial Nerves: Cranial nerves are intact.     Sensory: Sensation is intact.     Motor: Motor function is intact. No abnormal muscle tone.     Coordination: Coordination is intact.     Gait: Gait is intact.       ____________________________________________   LABS (all labs ordered are listed, but only abnormal results are displayed)  Labs Reviewed  BASIC METABOLIC PANEL - Abnormal; Notable for the following components:      Result Value   CO2 21 (*)    Glucose, Bld 101 (*)    All other components within normal limits  CBC  HEPATIC FUNCTION PANEL  CK   LAMOTRIGINE LEVEL  POC URINE PREG, ED    ____________________________________________  EKG:  ________________________________________  RADIOLOGY All imaging, including plain films, CT scans, and ultrasounds, independently reviewed by me, and interpretations confirmed via formal radiology reads.  ED MD interpretation:   CT Head: Normal CT Face: No acute abnormality  Official radiology report(s): CT Head Wo Contrast  Result Date: 04/21/2021 CLINICAL DATA:  Seizure, fall.  Facial trauma. EXAM: CT HEAD WITHOUT CONTRAST CT MAXILLOFACIAL WITHOUT CONTRAST TECHNIQUE: Multidetector CT imaging of the head and maxillofacial structures were performed using the standard protocol without intravenous contrast. Multiplanar CT image reconstructions of the maxillofacial structures were also generated. COMPARISON:  Head CT, 02/02/2021. FINDINGS: CT HEAD FINDINGS Brain: No evidence of acute infarction, hemorrhage, hydrocephalus, extra-axial collection or mass lesion/mass effect. Vascular: No hyperdense vessel or unexpected calcification. Skull: Normal. Negative for fracture or focal lesion. Other: None. CT MAXILLOFACIAL FINDINGS Osseous: No fracture or mandibular dislocation. No destructive process. Orbits: Negative. No traumatic or inflammatory finding. Sinuses: Clear. Soft tissues: Small area of contusion along the left cheek. Otherwise unremarkable. IMPRESSION: HEAD CT 1. Normal. MAXILLOFACIAL CT 1. Mild left cheek soft tissue contusion. 2. No fracture.  No other abnormality. Electronically Signed   By: Amie Portland M.D.   On: 04/21/2021 17:14   CT Maxillofacial Wo Contrast  Result Date: 04/21/2021 CLINICAL DATA:  Seizure, fall.  Facial trauma. EXAM: CT HEAD WITHOUT CONTRAST CT MAXILLOFACIAL WITHOUT CONTRAST TECHNIQUE: Multidetector CT imaging of the head and maxillofacial structures were performed using the standard protocol without intravenous contrast. Multiplanar CT image reconstructions of the  maxillofacial structures were also generated. COMPARISON:  Head CT, 02/02/2021. FINDINGS: CT HEAD FINDINGS Brain: No evidence of acute infarction, hemorrhage, hydrocephalus, extra-axial collection or mass lesion/mass effect. Vascular: No hyperdense vessel or unexpected calcification. Skull: Normal. Negative for fracture or focal lesion. Other: None. CT MAXILLOFACIAL FINDINGS Osseous: No fracture or mandibular dislocation. No destructive process. Orbits: Negative. No traumatic or inflammatory finding. Sinuses: Clear. Soft tissues: Small area of contusion along the left cheek. Otherwise unremarkable. IMPRESSION: HEAD CT 1. Normal. MAXILLOFACIAL CT 1. Mild left cheek soft tissue contusion. 2. No fracture.  No other abnormality. Electronically Signed   By: Amie Portland M.D.   On: 04/21/2021 17:14    ____________________________________________  PROCEDURES   Procedure(s) performed (including Critical Care):  Procedures  ____________________________________________  INITIAL IMPRESSION / MDM / ASSESSMENT AND PLAN / ED COURSE  As part of my medical decision making, I reviewed the following data within the electronic MEDICAL RECORD NUMBER Nursing notes reviewed and incorporated, Old chart reviewed, Notes from prior ED visits, and [place] Controlled Substance Database       *LABRITTANY WECHTER was evaluated in Emergency Department on 04/21/2021 for the symptoms described in the history of present illness. She was evaluated in the context of the global COVID-19 pandemic, which necessitated consideration that the patient might be at risk for infection with the SARS-CoV-2 virus that causes COVID-19. Institutional protocols and algorithms that pertain to the evaluation of patients at risk for COVID-19 are in a state of rapid change based on information released by regulatory bodies including the CDC and federal and state organizations. These policies and algorithms were followed during the patient's care in the ED.   Some ED evaluations and interventions may be delayed as a result of limited staffing during the pandemic.*     Medical Decision Making: 25 year old female here with likely recurrent seizure.  Patient is currently being seen by neurology for this as an outpatient.  CT head and face obtained in the setting of trauma and showed no evidence of acute fracture or malalignment.  Lab work is unremarkable including normal LFTs and CK.  Symptoms do sound like possible seizure activity, though it is unclear whether this is been captured on EEG.  EKGs previously obtained and are unremarkable.  She has had extensive work-up for these  episodes already.  She is back to her baseline.  Discussed case with neurology, will increase her Lamictal to 100 twice a day.  She has tolerated the 50 twice a day for at least the last several weeks.  Otherwise, return precautions given.  She will follow-up with neurology.  ____________________________________________  FINAL CLINICAL IMPRESSION(S) / ED DIAGNOSES  Final diagnoses:  Seizure-like activity (HCC)     MEDICATIONS GIVEN DURING THIS VISIT:  Medications  LORazepam (ATIVAN) tablet 1 mg (1 mg Oral Given 04/21/21 1641)     ED Discharge Orders         Ordered    LamoTRIgine 50 MG TB24 24 hour tablet  2 times daily        04/21/21 1745           Note:  This document was prepared using Dragon voice recognition software and may include unintentional dictation errors.   Shaune Pollack, MD 04/21/21 [phone]

## 2021-04-23 LAB — LAMOTRIGINE LEVEL: Lamotrigine Lvl: <1 ug/mL — ABNORMAL LOW (ref 2.0–20.0)

## 2021-04-26 DIAGNOSIS — E559 Vitamin D deficiency, unspecified: Secondary | ICD-10-CM | POA: Diagnosis not present

## 2021-04-26 DIAGNOSIS — G2581 Restless legs syndrome: Secondary | ICD-10-CM | POA: Diagnosis not present

## 2021-04-26 DIAGNOSIS — R569 Unspecified convulsions: Secondary | ICD-10-CM | POA: Diagnosis not present

## 2021-10-04 IMAGING — CT CT HEAD W/O CM
3 series · 15 of 47 positions shown, 18 images · non-contrast
Comparison: Head CT, 02/02/2021.

CLINICAL DATA: Seizure, fall.  Facial trauma.

EXAM:
CT HEAD WITHOUT CONTRAST
CT MAXILLOFACIAL WITHOUT CONTRAST
TECHNIQUE: Multidetector CT imaging of the head and maxillofacial structures
were performed using the standard protocol without intravenous
contrast. Multiplanar CT image reconstructions of the maxillofacial
structures were also generated.

[Series 2: head wo · axial · 0.39mm/px · z∈[-156,-31]mm · 9 of 31 slices shown, 12 images]
[im 3/31  brain]
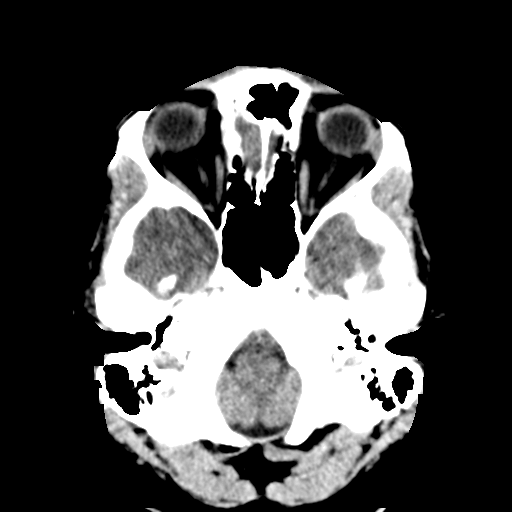
[im 3/31  bone]
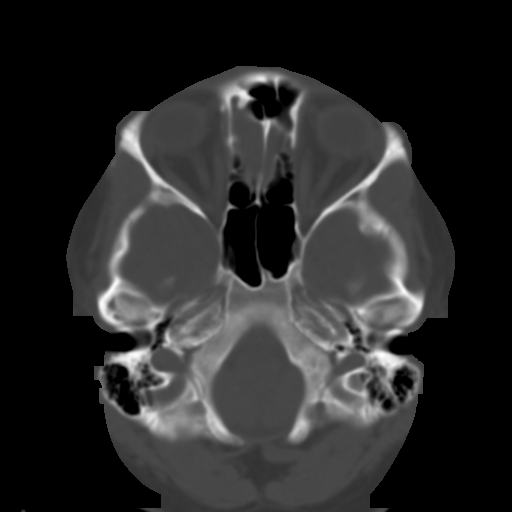
[im 6/31  brain]
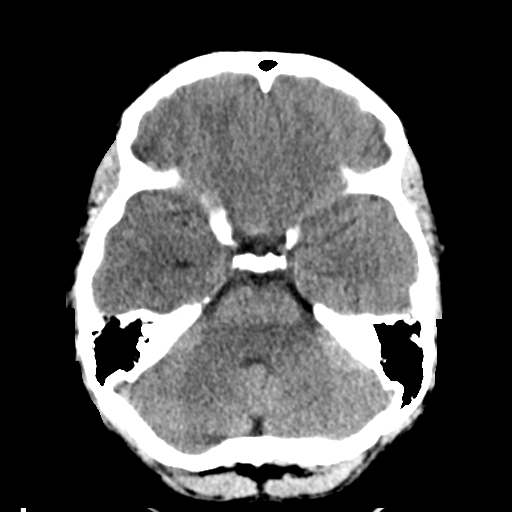
[im 9/31  brain]
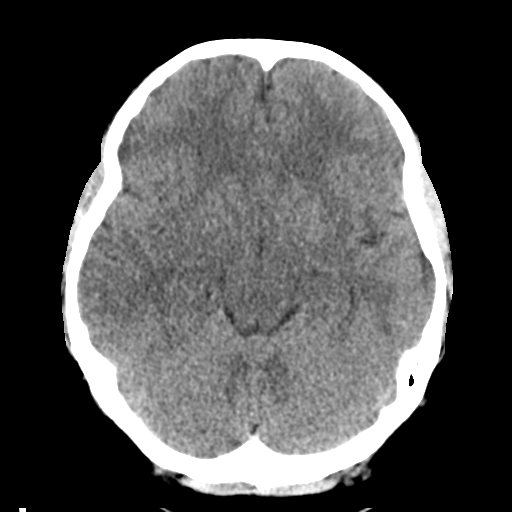
[im 12/31  brain]
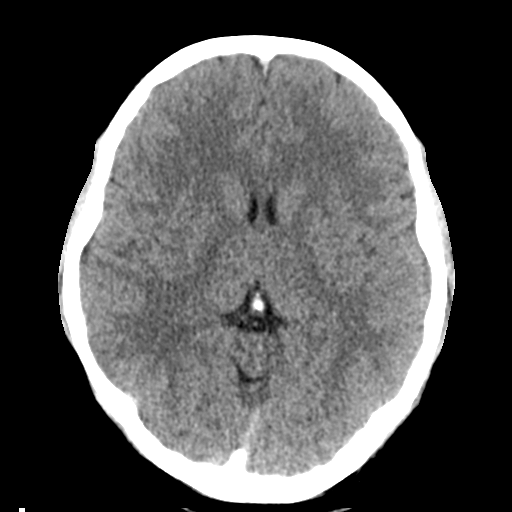
[im 16/31  brain]
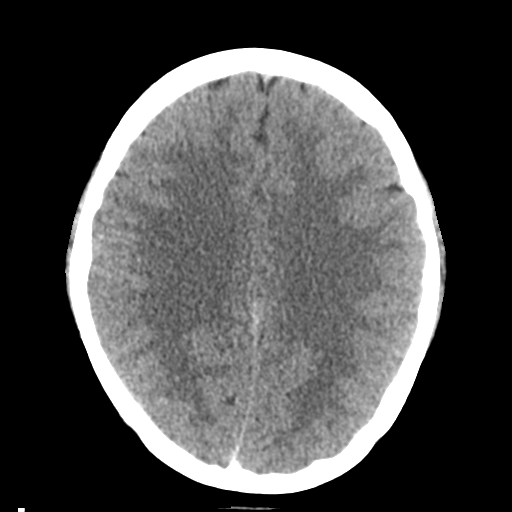
[im 16/31  bone]
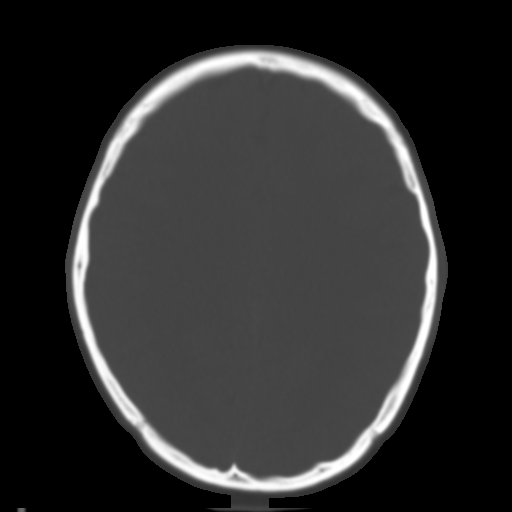
[im 19/31  brain]
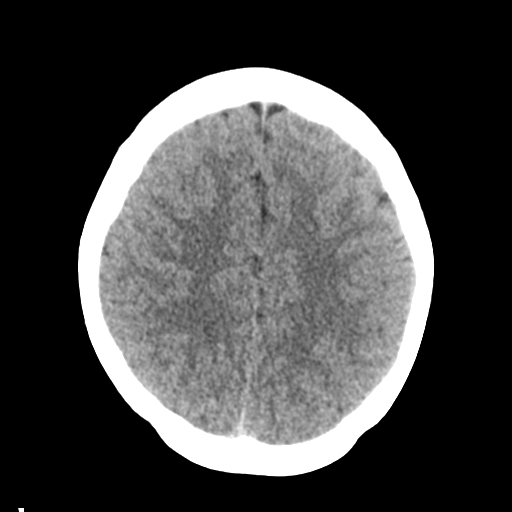
[im 22/31  brain]
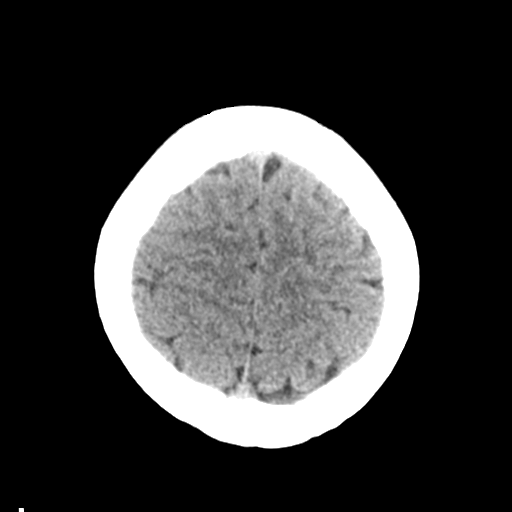
[im 25/31  brain]
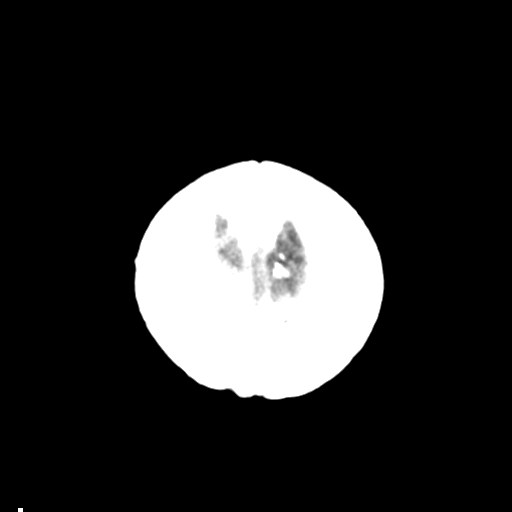
[im 28/31  brain]
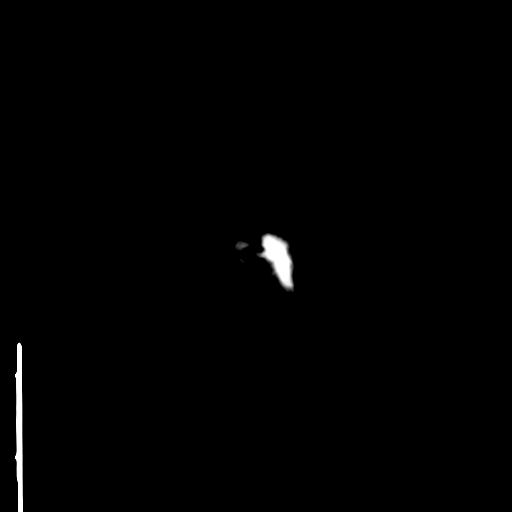
[im 28/31  bone]
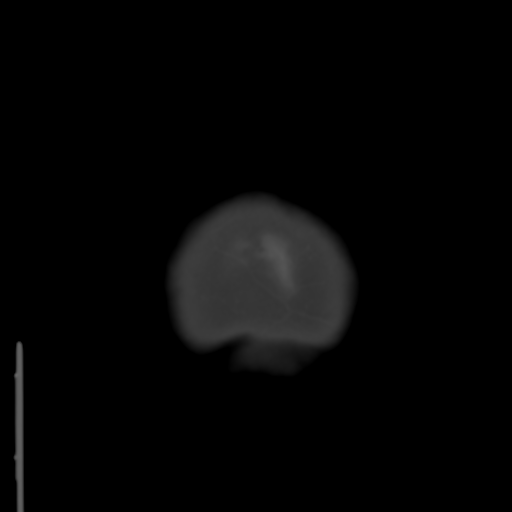

[Series 4: coronal soft tissue · coronal · 0.29mm/px · 3 of 68 slices shown]
[im 23/68  brain]
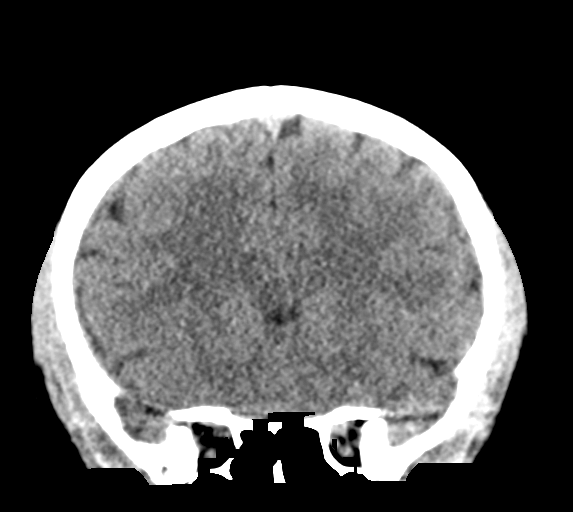
[im 30/68  brain]
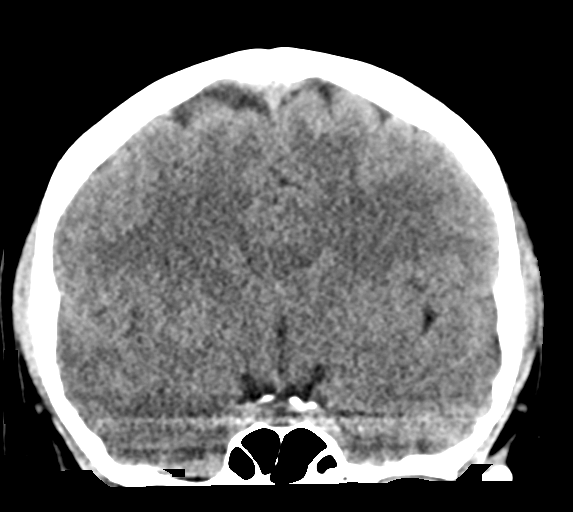
[im 38/68  brain]
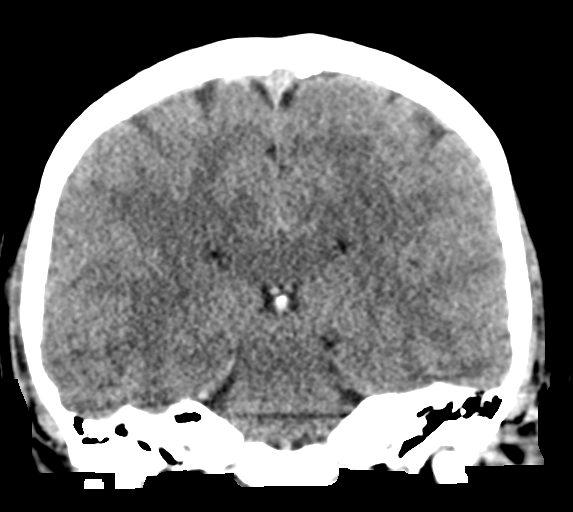

[Series 5: sagittal soft tissue · sagittal · 0.29mm/px · 3 of 57 slices shown]
[im 19/57  brain]
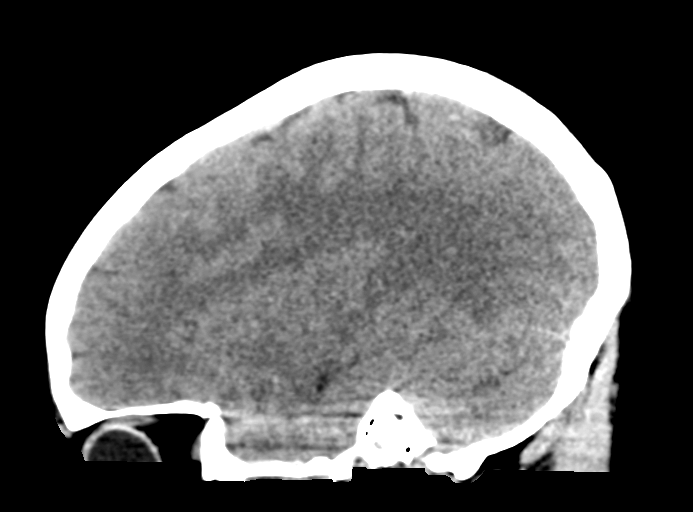
[im 29/57  brain]
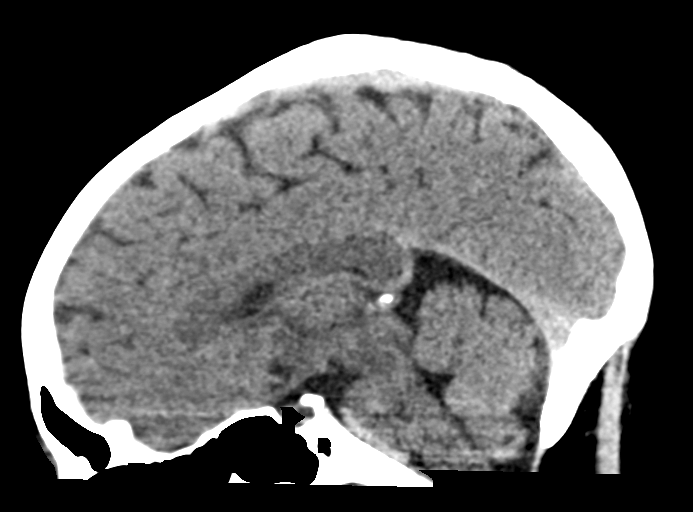
[im 38/57  brain]
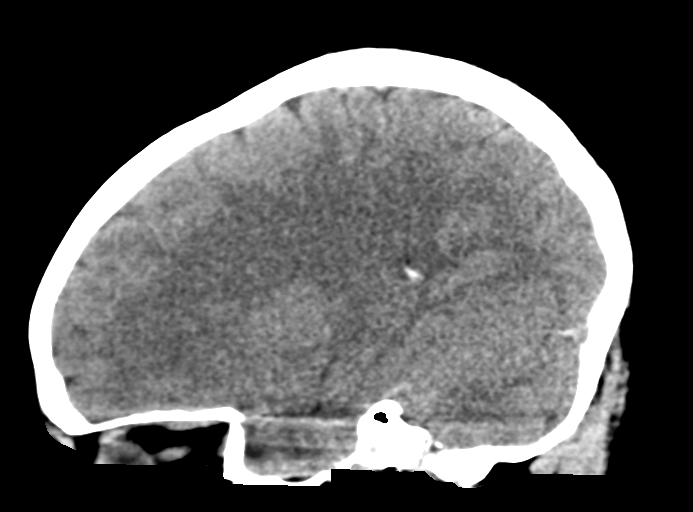

[15 of 47 positions shown; findings below may reference images not displayed]

FINDINGS: CT HEAD FINDINGS

Brain: No evidence of acute infarction, hemorrhage, hydrocephalus,
extra-axial collection or mass lesion/mass effect.

Vascular: No hyperdense vessel or unexpected calcification.

Skull: Normal. Negative for fracture or focal lesion.

Other: None.

CT MAXILLOFACIAL FINDINGS

Osseous: No fracture or mandibular dislocation. No destructive
process.

Orbits: Negative. No traumatic or inflammatory finding.

Sinuses: Clear.

Soft tissues: Small area of contusion along the left cheek.
Otherwise unremarkable.
IMPRESSION: HEAD CT

1. Normal.

MAXILLOFACIAL CT

1. Mild left cheek soft tissue contusion.
2. No fracture.  No other abnormality.

## 2021-11-12 ENCOUNTER — Ambulatory Visit
Admission: EM | Admit: 2021-11-12 | Discharge: 2021-11-12 | Disposition: A | Discharge status: Home / Self Care | Payer: BC Managed Care – PPO | Attending: Family Medicine | Admitting: Family Medicine

## 2021-11-12 ENCOUNTER — Other Ambulatory Visit: Payer: Self-pay

## 2021-11-12 ENCOUNTER — Encounter: Payer: Self-pay | Admitting: Emergency Medicine

## 2021-11-12 DIAGNOSIS — S39012A Strain of muscle, fascia and tendon of lower back, initial encounter: Secondary | ICD-10-CM

## 2021-11-12 MED ORDER — CYCLOBENZAPRINE HCL 5 MG PO TABS: 5.0000 mg | ORAL_TABLET | Freq: Three times a day (TID) | ORAL | 0 refills | Status: DC | PRN

## 2021-11-12 MED ORDER — PREDNISONE 10 MG PO TABS: 20.0000 mg | ORAL_TABLET | Freq: Every day | ORAL | 0 refills | Status: DC

## 2021-11-12 NOTE — ED Triage Notes (Signed)
Pt presents with reoccurring back pain but states has been unbearable Xs 2 days. Denies any fall or injury.

## 2021-11-12 NOTE — ED Provider Notes (Signed)
[facility]    CSN: [id number] Arrival date & time: 11/12/21  7829      History   Chief Complaint Chief Complaint  Patient presents with   Back Pain    low    HPI Julia Brooks is a 25 y.o. female.   Presenting today with 2-day history of worsening right lateral low back pain that she states has been off and on for the past 2 months.  She denies any known injury to the area, heavy lifting, new exercises but states that when she moves certain ways she feels like the area is spasming and she sometimes will have pain shooting down the back of her right leg.  Denies weakness, numbness, tingling, bowel or bladder incontinence, saddle anesthesia, fever, chills, history of back injuries.  Taking ibuprofen with no relief.   Past Medical History:  Diagnosis Date   Asthma    Migraine    Occasional tremors    Syncope and collapse     Patient Active Problem List   Diagnosis Date Noted   Near syncope 03/04/2014   Headache 03/04/2014    History reviewed. No pertinent surgical history.  OB History   No obstetric history on file.      Home Medications    Prior to Admission medications   Medication Sig Start Date End Date Taking? Authorizing Provider  predniSONE (DELTASONE) 10 MG tablet Take 2 tablets (20 mg total) by mouth daily. 11/12/21  Yes Particia Nearing, PA-C  albuterol (PROVENTIL HFA;VENTOLIN HFA) 108 (90 BASE) MCG/ACT inhaler Inhale 2 puffs into the lungs every 6 (six) hours as needed. For shortness of breath    [provider]  cyclobenzaprine (FLEXERIL) 5 MG tablet Take 1-2 tablets (5-10 mg total) by mouth 3 (three) times daily as needed for muscle spasms. Not drink alcohol or drive while taking this medication.  May cause drowsiness. 11/12/21   Particia Nearing, PA-C  ibuprofen (ADVIL,MOTRIN) 800 MG tablet Take 1 tablet (800 mg total) by mouth every 8 (eight) hours as needed. 12/03/17   Evon Slack, PA-C  LamoTRIgine 50 MG  TB24 24 hour tablet Take 2 tablets (100 mg total) by mouth in the morning and at bedtime. Take 100 mg total twice a day. If you already have the 50 mg capsules at home, take two capsules twice daily until out, then take this medication. DO NOT take both forms of lamictal. 04/21/21 05/21/21  Shaune Pollack, MD  ondansetron (ZOFRAN) 4 MG tablet Take 1 tablet (4 mg total) by mouth every 6 (six) hours. 12/11/12   Jethro Bastos, NP  SUMAtriptan (IMITREX) 50 MG tablet Take 50 mg by mouth every 2 (two) hours as needed. For migraine    [provider]  topiramate (TOPAMAX) 25 MG tablet Take 25 mg by mouth at bedtime.    [provider]    Family History History reviewed. No pertinent family history.  Social History Social History   Tobacco Use   Smoking status: Every Day    Packs/day: 0.50    Types: Cigarettes   Smokeless tobacco: Never  Substance Use Topics   Alcohol use: No   Drug use: No     Allergies   Patient has no known allergies.   Review of Systems Review of Systems Per HPI  Physical Exam Triage Vital Signs ED Triage Vitals  Enc Vitals Group     BP 11/12/21 1232 126/83     Pulse Rate 11/12/21 1232 88  Resp 11/12/21 1232 17     Temp 11/12/21 1232 98.3 F (36.8 C)     Temp Source 11/12/21 1232 Oral     SpO2 11/12/21 1232 97 %     Weight --      Height --      Head Circumference --      Peak Flow --      Pain Score 11/12/21 1203 6     Pain Loc --      Pain Edu? --      Excl. in GC? --    No data found.  Updated Vital Signs BP 126/83 (BP Location: Right Arm)   Pulse 88   Temp 98.3 F (36.8 C) (Oral)   Resp 17   SpO2 97%   Visual Acuity Right Eye Distance:   Left Eye Distance:   Bilateral Distance:    Right Eye Near:   Left Eye Near:    Bilateral Near:     Physical Exam Vitals and nursing note reviewed.  Constitutional:      Appearance: Normal appearance. She is not ill-appearing.  HENT:     Head: Atraumatic.      Mouth/Throat:     Mouth: Mucous membranes are moist.  Eyes:     Extraocular Movements: Extraocular movements intact.     Conjunctiva/sclera: Conjunctivae normal.  Cardiovascular:     Rate and Rhythm: Normal rate and regular rhythm.     Heart sounds: Normal heart sounds.  Pulmonary:     Effort: Pulmonary effort is normal.     Breath sounds: Normal breath sounds.  Musculoskeletal:        General: Tenderness present. No swelling, deformity or signs of injury. Normal range of motion.     Cervical back: Normal range of motion and neck supple.     Comments: Negative straight leg raise bilateral lower extremities.  Normal gait and range of motion diffusely.  Tenderness to palpation localized to right lateral lumbar musculature  Skin:    General: Skin is warm and dry.  Neurological:     Mental Status: She is alert and oriented to person, place, and time.     Sensory: No sensory deficit.     Motor: No weakness.     Gait: Gait normal.     Comments: Bilateral lower extremities neurovascularly intact  Psychiatric:        Mood and Affect: Mood normal.        Thought Content: Thought content normal.        Judgment: Judgment normal.     UC Treatments / Results  Labs (all labs ordered are listed, but only abnormal results are displayed) Labs Reviewed - No data to display  EKG   Radiology No results found.  Procedures Procedures (including critical care time)  Medications Ordered in UC Medications - No data to display  Initial Impression / Assessment and Plan / UC Course  I have reviewed the triage vital signs and the nursing notes.  Pertinent labs & imaging results that were available during my care of the patient were reviewed by me and considered in my medical decision making (see chart for details).     We will treat for muscular strain with Flexeril, prednisone, rest, stretches, heat.  Work note given.  Sports med follow-up for unresolving issues.  Final Clinical  Impressions(s) / UC Diagnoses   Final diagnoses:  Strain of lumbar region, initial encounter   Discharge Instructions   None    ED Prescriptions  Medication Sig Dispense Auth. Provider   cyclobenzaprine (FLEXERIL) 5 MG tablet  (Status: Discontinued) Take 1-2 tablets (5-10 mg total) by mouth 3 (three) times daily as needed for muscle spasms. Not drink alcohol or drive while taking this medication.  May cause drowsiness. 20 tablet Particia Nearing, [place]   predniSONE (DELTASONE) 10 MG tablet Take 2 tablets (20 mg total) by mouth daily. 15 tablet Particia Nearing, [place]   cyclobenzaprine (FLEXERIL) 5 MG tablet Take 1-2 tablets (5-10 mg total) by mouth 3 (three) times daily as needed for muscle spasms. Not drink alcohol or drive while taking this medication.  May cause drowsiness. 20 tablet Particia Nearing, [place]      PDMP not reviewed this encounter.   Particia Nearing, [place] 11/12/21 1337

## 2022-11-18 ENCOUNTER — Other Ambulatory Visit: Payer: Self-pay

## 2022-11-18 ENCOUNTER — Emergency Department
Admission: EM | Admit: 2022-11-18 | Discharge: 2022-11-18 | Disposition: A | Discharge status: Home / Self Care | Payer: BC Managed Care – PPO | Attending: Emergency Medicine | Admitting: Emergency Medicine

## 2022-11-18 ENCOUNTER — Emergency Department: Payer: BC Managed Care – PPO

## 2022-11-18 DIAGNOSIS — S01112A Laceration without foreign body of left eyelid and periocular area, initial encounter: Secondary | ICD-10-CM | POA: Diagnosis not present

## 2022-11-18 DIAGNOSIS — Y990 Civilian activity done for income or pay: Secondary | ICD-10-CM | POA: Insufficient documentation

## 2022-11-18 DIAGNOSIS — W228XXA Striking against or struck by other objects, initial encounter: Secondary | ICD-10-CM | POA: Diagnosis not present

## 2022-11-18 DIAGNOSIS — Z23 Encounter for immunization: Secondary | ICD-10-CM | POA: Diagnosis not present

## 2022-11-18 DIAGNOSIS — M542 Cervicalgia: Secondary | ICD-10-CM | POA: Diagnosis not present

## 2022-11-18 DIAGNOSIS — S0101XA Laceration without foreign body of scalp, initial encounter: Secondary | ICD-10-CM

## 2022-11-18 DIAGNOSIS — S0181XA Laceration without foreign body of other part of head, initial encounter: Secondary | ICD-10-CM | POA: Diagnosis present

## 2022-11-18 MED ORDER — TETANUS-DIPHTH-ACELL PERTUSSIS 5-2.5-18.5 LF-MCG/0.5 IM SUSY: 0.5000 mL | PREFILLED_SYRINGE | Freq: Once | INTRAMUSCULAR | Status: AC

## 2022-11-18 MED ORDER — TETANUS-DIPHTH-ACELL PERTUSSIS 5-2.5-18.5 LF-MCG/0.5 IM SUSY
PREFILLED_SYRINGE | INTRAMUSCULAR | Status: AC
  Administered 2022-11-18: 0.5 mL via INTRAMUSCULAR
  Filled 2022-11-18: qty 0.5

## 2022-11-18 NOTE — ED Triage Notes (Signed)
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 Superficial laceration noted, denies any loc. Pt does want to file workmans comp.

## 2022-11-18 NOTE — Discharge Instructions (Addendum)
Avoid sports-  If you develop concussion symptoms follow up with primary doctor.  Sutures removed in 3-5 days.

## 2022-11-18 NOTE — ED Notes (Signed)
Says she hit head on box/fan and shelf at work this am.  Says no drug screen required for worker comp.

## 2022-11-18 NOTE — ED Provider Notes (Signed)
[facility] Provider Note    Event Date/Time   First MD Initiated Contact with Patient 11/18/22 [phone]     (approximate)   History   Laceration   HPI  Julia Brooks is a 26 y.o. female who comes in with a laceration from work.  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  Patient reports that she hit her head twice where she initially hit the got the laceration then came down again and had a second time she does report neck pain.  She reports having a really bad headache.  Reports history of seizures but not currently taking medications.  She denies any other injuries denies LOC.  Unknown last tetanus  Physical Exam   Triage Vital Signs: ED Triage Vitals [11/18/22 0729]  Enc Vitals Group     BP (!) 130/94     Pulse Rate 65     Resp 18     Temp 98.9 F (37.2 C)     Temp Source Oral     SpO2 97 %     Weight 160 lb (72.6 kg)     Height 5\' 1"  (1.549 m)     Head Circumference      Peak Flow      Pain Score 8     Pain Loc      Pain Edu?      Excl. in GC?     Most recent vital signs: Vitals:   11/18/22 0729  BP: (!) 130/94  Pulse: 65  Resp: 18  Temp: 98.9 F (37.2 C)  SpO2: 97%     General: Awake, no distress.  CV:  Good peripheral perfusion.  Resp:  Normal effort.  Abd:  No distention.  Other:  Laceration above the left eye.  Some C-spine tenderness. No lower oribital tenderness, EOMI. Normal vision out of L eye.  Pupil reactive    ED Results / Procedures / Treatments   Labs (all labs ordered are listed, but only abnormal results are displayed) Labs Reviewed - No data to display     RADIOLOGY I have reviewed the CT head personally and interpretted and nO ICH    PROCEDURES:  Critical Care performed: No  ..Laceration Repair  Date/Time: 11/18/2022 9:55 AM  Performed by: 14/03/2022, MD Authorized by: Concha Se, MD   Consent:    Consent obtained:   Verbal   Consent given by:  Patient   Risks discussed:  Pain, retained foreign body and infection   Alternatives discussed:  No treatment Universal protocol:    Patient identity confirmed:  Verbally with patient Anesthesia:    Anesthesia method:  Local infiltration   Local anesthetic:  Lidocaine 1% w/o epi Laceration details:    Location:  Face   Face location:  L eyebrow   Length (cm):  3   Depth (mm):  5 Exploration:    Contaminated: no   Treatment:    Area cleansed with:  Saline   Amount of cleaning:  Standard   Irrigation method:  Syringe Skin repair:    Repair method:  Sutures   Suture size:  6-0   Suture material:  Prolene   Number of sutures:  4 Approximation:    Approximation:  Close Repair type:    Repair type:  Simple Post-procedure details:    Dressing:  Open (no dressing)    MEDICATIONS ORDERED IN ED: Medications  Tdap (BOOSTRIX) injection 0.5 mL (0.5 mLs Intramuscular Given 11/18/22 0834)     IMPRESSION / MDM / ASSESSMENT AND PLAN / ED COURSE  I reviewed the triage vital signs and the nursing notes.   Patient's presentation is most consistent with acute presentation with potential threat to life or bodily function.   Patient comes in with head trauma differential includes intracranial hemorrhage, cervical fracture.  Patient overall low risk for for these but discussed with patient and she would like to proceed with CT scan.  Patient's tetanus will be updated  Ct head  and neck negative  Laceration was repaired.  Patient will need sutures removed in 3 to 5 days.  She understands concussion precautions but no symptoms at this time.         FINAL CLINICAL IMPRESSION(S) / ED DIAGNOSES   Final diagnoses:  Laceration of scalp without foreign body, initial encounter     Rx / DC Orders   ED Discharge Orders     None        Note:  This document was prepared using Dragon voice recognition software and may include unintentional dictation  errors.   Concha Se, MD 11/18/22 [phone]

## 2022-11-22 ENCOUNTER — Emergency Department
Admission: EM | Admit: 2022-11-22 | Discharge: 2022-11-22 | Disposition: A | Discharge status: Home / Self Care | Payer: BC Managed Care – PPO | Attending: Emergency Medicine | Admitting: Emergency Medicine

## 2022-11-22 DIAGNOSIS — Z4802 Encounter for removal of sutures: Secondary | ICD-10-CM | POA: Insufficient documentation

## 2022-11-22 NOTE — ED Triage Notes (Signed)
Pt reports she is here to have suture removed from left eye brow. Denies issues.

## 2022-11-22 NOTE — ED Provider Notes (Signed)
[facility] Provider Note    None    (approximate)   History   Chief Complaint Suture / Staple Removal   HPI Julia Brooks is a 26 y.o. female, history of asthma, presents to the emergency department for suture removal.  She states that she sustained a laceration after hitting her left eyebrow on a metal fan approximately 4 days ago.  She presented here and had 4 sutures placed.  Since the incident, she states that her laceration has healed up well and has not had any complications.  She is overall felt well.  Denies fever/chills, nausea/vomiting, dizziness/lightheadedness, myalgias, or weakness.  History Limitations: No limitations.        Physical Exam  Triage Vital Signs: ED Triage Vitals [11/22/22 0901]  Enc Vitals Group     BP (!) 135/91     Pulse Rate 73     Resp 16     Temp 98.2 F (36.8 C)     Temp Source Oral     SpO2 97 %     Weight 158 lb 11.7 oz (72 kg)     Height 5\' 1"  (1.549 m)     Head Circumference      Peak Flow      Pain Score 0     Pain Loc      Pain Edu?      Excl. in GC?     Most recent vital signs: Vitals:   11/22/22 0901  BP: (!) 135/91  Pulse: 73  Resp: 16  Temp: 98.2 F (36.8 C)  SpO2: 97%    General: Awake, NAD.  Skin: Warm, dry. No rashes or lesions.  Eyes: PERRL. Conjunctivae normal.  CV: Good peripheral perfusion.  Resp: Normal effort.  Abd: Soft, non-tender. No distention.  Neuro: At baseline. No gross neurological deficits.  Musculoskeletal: Normal ROM of all extremities.  Focused Exam: Healing 2 cm linear laceration across the left eyebrow.  Sutures remain intact.  No dehiscence.  No surrounding warmth or erythema.  No tenderness.  Physical Exam    ED Results / Procedures / Treatments  Labs (all labs ordered are listed, but only abnormal results are displayed) Labs Reviewed - No data to display   EKG N/A.    RADIOLOGY  ED Provider Interpretation: N/A.  No results  found.  PROCEDURES:  Critical Care performed: N/A.  14/08/23Suture Removal  Date/Time: 11/22/2022 3:08 PM  Performed by: 14/07/2022, PA Authorized by: Varney Daily, PA   Consent:    Consent obtained:  Verbal   Consent given by:  Patient   Risks, benefits, and alternatives were discussed: yes     Risks discussed:  Bleeding, pain and wound separation   Alternatives discussed:  No treatment Universal protocol:    Patient identity confirmed:  Verbally with patient Location:    Location:  Head/neck   Head/neck location:  Eyebrow   Eyebrow location:  L eyebrow Procedure details:    Wound appearance:  No signs of infection, good wound healing and clean   Number of sutures removed:  4   Number of staples removed:  0 Post-procedure details:    Post-removal:  No dressing applied   Procedure completion:  Tolerated well, no immediate complications     MEDICATIONS ORDERED IN ED: Medications - No data to display   IMPRESSION / MDM / ASSESSMENT AND PLAN / ED COURSE  I reviewed the triage vital signs and the nursing notes.  Differential diagnosis includes, but is not limited to, encounter for suture removal, laceration, dehiscence, cellulitis.  Assessment/Plan Pain presented here for suture removal following laceration to the left eyebrow.  Laceration appears to be healing well.  No evidence of infection.  No dehiscence.  Successfully remove the sutures.  Patient tolerated seizure well with no immediate complications.  Encouraged her to continue to wash the site daily and apply topical antibiotic ointment daily for the next few days as well.  She was amenable to this plan.  Will discharge.  Provided the patient with anticipatory guidance, return precautions, and educational material. Encouraged the patient to return to the emergency department at any time if they begin to experience any new or worsening symptoms. Patient expressed understanding  and agreed with the plan.   Patient's presentation is most consistent with acute, uncomplicated illness.       FINAL CLINICAL IMPRESSION(S) / ED DIAGNOSES   Final diagnoses:  Visit for suture removal     Rx / DC Orders   ED Discharge Orders     None        Note:  This document was prepared using Dragon voice recognition software and may include unintentional dictation errors.   Varney Daily, [place] 11/22/22 1508    Merwyn Katos, MD 11/22/22 1534

## 2022-11-22 NOTE — Discharge Instructions (Signed)
-  Please review the material provided. Return to the ED at anytime if you begin to experience any new or worsening symptoms.

## 2024-01-19 ENCOUNTER — Emergency Department: Payer: 59

## 2024-01-19 ENCOUNTER — Other Ambulatory Visit: Payer: Self-pay

## 2024-01-19 ENCOUNTER — Emergency Department
Admission: EM | Admit: 2024-01-19 | Discharge: 2024-01-19 | Disposition: A | Discharge status: Home / Self Care | Payer: 59 | Attending: Emergency Medicine | Admitting: Emergency Medicine

## 2024-01-19 DIAGNOSIS — J45909 Unspecified asthma, uncomplicated: Secondary | ICD-10-CM | POA: Diagnosis not present

## 2024-01-19 DIAGNOSIS — Z20822 Contact with and (suspected) exposure to covid-19: Secondary | ICD-10-CM | POA: Insufficient documentation

## 2024-01-19 DIAGNOSIS — R059 Cough, unspecified: Secondary | ICD-10-CM | POA: Diagnosis present

## 2024-01-19 DIAGNOSIS — J101 Influenza due to other identified influenza virus with other respiratory manifestations: Secondary | ICD-10-CM | POA: Diagnosis not present

## 2024-01-19 LAB — RESP PANEL BY RT-PCR (RSV, FLU A&B, COVID)  RVPGX2
Influenza A by PCR: POSITIVE — AB
Influenza B by PCR: NEGATIVE
Resp Syncytial Virus by PCR: NEGATIVE
SARS Coronavirus 2 by RT PCR: NEGATIVE

## 2024-01-19 LAB — GROUP A STREP BY PCR: Group A Strep by PCR: NOT DETECTED

## 2024-01-19 MED ORDER — KETOROLAC TROMETHAMINE 30 MG/ML IJ SOLN
30.0000 mg | Freq: Once | INTRAMUSCULAR | Status: AC
  Administered 2024-01-19: 30 mg via INTRAVENOUS
  Filled 2024-01-19: qty 1

## 2024-01-19 MED ORDER — ONDANSETRON HCL 4 MG/2ML IJ SOLN
4.0000 mg | Freq: Once | INTRAMUSCULAR | Status: AC
  Administered 2024-01-19: 4 mg via INTRAVENOUS
  Filled 2024-01-19: qty 2

## 2024-01-19 MED ORDER — OSELTAMIVIR PHOSPHATE 75 MG PO CAPS: 75.0000 mg | ORAL_CAPSULE | Freq: Two times a day (BID) | ORAL | 0 refills | Status: AC

## 2024-01-19 MED ORDER — ACETAMINOPHEN 325 MG PO TABS
650.0000 mg | ORAL_TABLET | Freq: Once | ORAL | Status: AC | PRN
  Administered 2024-01-19: 650 mg via ORAL
  Filled 2024-01-19: qty 2

## 2024-01-19 MED ORDER — LIDOCAINE VISCOUS HCL 2 % MT SOLN
15.0000 mL | Freq: Once | OROMUCOSAL | Status: DC
  Filled 2024-01-19: qty 15

## 2024-01-19 MED ORDER — SODIUM CHLORIDE 0.9 % IV BOLUS (SEPSIS)
1000.0000 mL | Freq: Once | INTRAVENOUS | Status: AC
  Administered 2024-01-19: 1000 mL via INTRAVENOUS

## 2024-01-19 MED ORDER — ONDANSETRON 4 MG PO TBDP: 4.0000 mg | ORAL_TABLET | Freq: Four times a day (QID) | ORAL | 0 refills | Status: DC | PRN

## 2024-01-19 NOTE — Discharge Instructions (Signed)
You may alternate Tylenol 1000 mg every 6 hours as needed for fever and pain and ibuprofen 800 mg every 6-8 hours as needed for fever and pain. Please rest and drink plenty of fluids. This is a viral illness causing your symptoms. You do not need antibiotics for a virus. You may use over-the-counter nasal saline spray and Afrin nasal saline spray as needed for nasal congestion. Please do not use Afrin for more than 3 days in a row. You may use guaifenesin and dextromethorphan as needed for cough.  You may use lozenges and Chloraseptic spray to help with sore throat.  Warm salt water gargles can also help with sore throat.  You may use over-the-counter Unisom (doxyalamine) or Benadryl (diphenhydramine) to help with sleep.  Please note that some combination medicines such as DayQuil and NyQuil have multiple medications in them.  Please make sure you look at all labels to ensure that you are not taking too much of any one particular medication.  Symptoms from a virus may take 7-14 days to run its course.  You may also use honey and salt water gargles to help with sore throat, cough, postnasal drainage.  The flu is treated like any other virus with supportive measures as listed above.  We have provided you with a prescription for Tamiflu.  These medications have to be taken within the first 48 hours of symptoms.  Tamiflu has many side effects including nausea, vomiting and diarrhea.

## 2024-01-19 NOTE — ED Triage Notes (Signed)
Reports cough with mucous, headache, fever at home (unchecked). Temp in triage was 100.5.Symptoms started Saturday night.

## 2024-01-19 NOTE — ED Provider Notes (Signed)
[facility] Provider Note    Event Date/Time   First MD Initiated Contact with Patient 01/19/24 [phone]     (approximate)   History   Cough   HPI  Julia Brooks is a 28 y.o. female with history of migraines, asthma, seizures who presents to the emergency department with fevers, headache, cough, congestion, sore throat, nausea and vomiting for the past 2 days.  No diarrhea.  Denies urinary symptoms.   History provided by patient, significant other.    Past Medical History:  Diagnosis Date   Asthma    Migraine    Occasional tremors    Syncope and collapse     History reviewed. No pertinent surgical history.  MEDICATIONS:  Prior to Admission medications   Medication Sig Start Date End Date Taking? Authorizing Provider  albuterol (PROVENTIL HFA;VENTOLIN HFA) 108 (90 BASE) MCG/ACT inhaler Inhale 2 puffs into the lungs every 6 (six) hours as needed. For shortness of breath    [provider]  cyclobenzaprine (FLEXERIL) 5 MG tablet Take 1-2 tablets (5-10 mg total) by mouth 3 (three) times daily as needed for muscle spasms. Not drink alcohol or drive while taking this medication.  May cause drowsiness. 11/12/21   Particia Nearing, PA-C  ibuprofen (ADVIL,MOTRIN) 800 MG tablet Take 1 tablet (800 mg total) by mouth every 8 (eight) hours as needed. 12/03/17   Evon Slack, PA-C  LamoTRIgine 50 MG TB24 24 hour tablet Take 2 tablets (100 mg total) by mouth in the morning and at bedtime. Take 100 mg total twice a day. If you already have the 50 mg capsules at home, take two capsules twice daily until out, then take this medication. DO NOT take both forms of lamictal. 04/21/21 05/21/21  Shaune Pollack, MD  ondansetron (ZOFRAN) 4 MG tablet Take 1 tablet (4 mg total) by mouth every 6 (six) hours. 12/11/12   Jethro Bastos, NP  predniSONE (DELTASONE) 10 MG tablet Take 2 tablets (20 mg total) by mouth daily. 11/12/21   Particia Nearing, PA-C   SUMAtriptan (IMITREX) 50 MG tablet Take 50 mg by mouth every 2 (two) hours as needed. For migraine    [provider]  topiramate (TOPAMAX) 25 MG tablet Take 25 mg by mouth at bedtime.    [provider]    Physical Exam   Triage Vital Signs: ED Triage Vitals  Encounter Vitals Group     BP 01/19/24 0310 (!) 141/48     Systolic BP Percentile --      Diastolic BP Percentile --      Pulse Rate 01/19/24 0310 (!) 120     Resp 01/19/24 0310 (!) 21     Temp 01/19/24 0310 (!) 100.5 F (38.1 C)     Temp Source 01/19/24 0310 Oral     SpO2 01/19/24 0310 95 %     Weight 01/19/24 0310 200 lb (90.7 kg)     Height 01/19/24 0310 5\' 1"  (1.549 m)     Head Circumference --      Peak Flow --      Pain Score 01/19/24 0316 6     Pain Loc --      Pain Education --      Exclude from Growth Chart --     Most recent vital signs: Vitals:   01/19/24 0316 01/19/24 0605  BP:  (!) 106/57  Pulse:  86  Resp:  17  Temp:  98.9 F (37.2 C)  SpO2: 95% 97%    CONSTITUTIONAL: Alert, responds appropriately to questions. Well-appearing; well-nourished HEAD: Normocephalic, atraumatic EYES: Conjunctivae clear, pupils appear equal, sclera nonicteric ENT: normal nose; moist mucous membranes; No pharyngeal erythema or petechiae, no tonsillar hypertrophy or exudate, no uvular deviation, no unilateral swelling in posterior oropharynx, no trismus or drooling, no muffled voice, normal phonation, no stridor, airway patent. NECK: Supple, normal ROM, no meningismus CARD: Regular and tachycardic; S1 and S2 appreciated RESP: Normal chest excursion without splinting or tachypnea; breath sounds clear and equal bilaterally; no wheezes, no rhonchi, no rales, no hypoxia or respiratory distress, speaking full sentences ABD/GI: Non-distended; soft, non-tender, no rebound, no guarding, no peritoneal signs, no tenderness at McBurney's point BACK: The back appears normal EXT: Normal ROM in all joints; no  deformity noted, no edema SKIN: Normal color for age and race; warm; no rash on exposed skin NEURO: Moves all extremities equally, normal speech PSYCH: The patient's mood and manner are appropriate.   ED Results / Procedures / Treatments   LABS: (all labs ordered are listed, but only abnormal results are displayed) Labs Reviewed  RESP PANEL BY RT-PCR (RSV, FLU A&B, COVID)  RVPGX2 - Abnormal; Notable for the following components:      Result Value   Influenza A by PCR POSITIVE (*)    All other components within normal limits  GROUP A STREP BY PCR     EKG:  RADIOLOGY: My personal review and interpretation of imaging: Chest x-ray clear.  I have personally reviewed all radiology reports.   DG Chest 2 View Result Date: 01/19/2024 CLINICAL DATA:  Cough. EXAM: CHEST - 2 VIEW COMPARISON:  Chest x-ray 12/11/2012. FINDINGS: The heart size and mediastinal contours are within normal limits. Both lungs are clear. No visible pleural effusions or pneumothorax. No acute osseous abnormality. IMPRESSION: No active cardiopulmonary disease. Electronically Signed   By: Feliberto Harts M.D.   On: 01/19/2024 03:33     PROCEDURES:  Critical Care performed: No      Procedures    IMPRESSION / MDM / ASSESSMENT AND PLAN / ED COURSE  I reviewed the triage vital signs and the nursing notes.    Patient here with flulike symptoms.     DIFFERENTIAL DIAGNOSIS (includes but not limited to):   Viral illness, pneumonia, gastroenteritis, doubt meningitis, sepsis, bacteremia   Patient's presentation is most consistent with acute complicated illness / injury requiring diagnostic workup.   PLAN: Patient is positive for influenza A.  COVID, RSV, strep negative.  Chest x-ray reviewed and interpreted by myself and the radiologist and is negative.  Patient febrile, tachycardic, tachypneic but nontoxic in appearance.  Will recheck vitals after antipyretics.  Will give IV fluids, Toradol, Zofran for  symptomatic relief.  Patient complaining of sore throat.  No signs of tonsillitis.  Strep is negative.  No sign of deep space neck infection, PTA, uvulitis.   MEDICATIONS GIVEN IN ED: Medications  lidocaine (XYLOCAINE) 2 % viscous mouth solution 15 mL (15 mLs Mouth/Throat Not Given 01/19/24 0559)  acetaminophen (TYLENOL) tablet 650 mg (650 mg Oral Given 01/19/24 0321)  sodium chloride 0.9 % bolus 1,000 mL (1,000 mLs Intravenous New Bag/Given 01/19/24 0559)  ketorolac (TORADOL) 30 MG/ML injection 30 mg (30 mg Intravenous Given 01/19/24 0558)  ondansetron (ZOFRAN) injection 4 mg (4 mg Intravenous Given 01/19/24 0559)     ED COURSE: Patient reports feeling much better.  Tolerating p.o.  Discussed risk and benefits of antivirals.  Patient would like a prescription for Tamiflu.  Will discharge with Zofran as well.  Discussed supportive care instructions and return precautions.   At this time, I do not feel there is any life-threatening condition present. I reviewed all nursing notes, vitals, pertinent previous records.  All lab and urine results, EKGs, imaging ordered have been independently reviewed and interpreted by myself.  I reviewed all available radiology reports from any imaging ordered this visit.  Based on my assessment, I feel the patient is safe to be discharged home without further emergent workup and can continue workup as an outpatient as needed. Discussed all findings, treatment plan as well as usual and customary return precautions.  They verbalize understanding and are comfortable with this plan.  Outpatient follow-up has been provided as needed.  All questions have been answered.    CONSULTS:  none   OUTSIDE RECORDS REVIEWED: Reviewed last neurology note in 2022.       FINAL CLINICAL IMPRESSION(S) / ED DIAGNOSES   Final diagnoses:  Influenza A     Rx / DC Orders   ED Discharge Orders          Ordered    oseltamivir (TAMIFLU) 75 MG capsule  2 times daily         01/19/24 0636    ondansetron (ZOFRAN-ODT) 4 MG disintegrating tablet  Every 6 hours PRN        01/19/24 0636             Note:  This document was prepared using Dragon voice recognition software and may include unintentional dictation errors.   Airiana Elman, Layla Maw, DO 01/19/24 [phone]

## 2024-01-20 ENCOUNTER — Emergency Department (HOSPITAL_COMMUNITY): Payer: 59

## 2024-01-20 ENCOUNTER — Emergency Department (HOSPITAL_COMMUNITY)
Admission: EM | Admit: 2024-01-20 | Discharge: 2024-01-20 | Disposition: A | Discharge status: Home / Self Care | Payer: 59 | Attending: Emergency Medicine | Admitting: Emergency Medicine

## 2024-01-20 DIAGNOSIS — R059 Cough, unspecified: Secondary | ICD-10-CM | POA: Diagnosis present

## 2024-01-20 DIAGNOSIS — J111 Influenza due to unidentified influenza virus with other respiratory manifestations: Secondary | ICD-10-CM | POA: Insufficient documentation

## 2024-01-20 LAB — COMPREHENSIVE METABOLIC PANEL
ALT: 49 U/L — ABNORMAL HIGH (ref 0–44)
AST: 42 U/L — ABNORMAL HIGH (ref 15–41)
Albumin: 3.3 g/dL — ABNORMAL LOW (ref 3.5–5.0)
Alkaline Phosphatase: 62 U/L (ref 38–126)
Anion gap: 12 (ref 5–15)
BUN: 6 mg/dL (ref 6–20)
CO2: 21 mmol/L — ABNORMAL LOW (ref 22–32)
Calcium: 8.5 mg/dL — ABNORMAL LOW (ref 8.9–10.3)
Chloride: 106 mmol/L (ref 98–111)
Creatinine, Ser: 0.89 mg/dL (ref 0.44–1.00)
GFR, Estimated: >60 mL/min (ref >60)
Glucose, Bld: 127 mg/dL — ABNORMAL HIGH (ref 70–99)
Potassium: 3.5 mmol/L (ref 3.5–5.1)
Sodium: 139 mmol/L (ref 135–145)
Total Bilirubin: 0.2 mg/dL (ref 0.0–1.2)
Total Protein: 6.7 g/dL (ref 6.5–8.1)

## 2024-01-20 LAB — CBC WITH DIFFERENTIAL/PLATELET
Abs Immature Granulocytes: 0.03 10*3/uL (ref 0.00–0.07)
Basophils Absolute: 0.1 10*3/uL (ref 0.0–0.1)
Basophils Relative: 1 %
Eosinophils Absolute: 0 10*3/uL (ref 0.0–0.5)
Eosinophils Relative: 0 %
HCT: 42.1 % (ref 36.0–46.0)
Hemoglobin: 13.9 g/dL (ref 12.0–15.0)
Immature Granulocytes: 0 %
Lymphocytes Relative: 12 %
Lymphs Abs: 1.1 10*3/uL (ref 0.7–4.0)
MCH: 30.8 pg (ref 26.0–34.0)
MCHC: 33 g/dL (ref 30.0–36.0)
MCV: 93.1 fL (ref 80.0–100.0)
Monocytes Absolute: 0.9 10*3/uL (ref 0.1–1.0)
Monocytes Relative: 10 %
Neutro Abs: 6.8 10*3/uL (ref 1.7–7.7)
Neutrophils Relative %: 77 %
Platelets: 278 10*3/uL (ref 150–400)
RBC: 4.52 MIL/uL (ref 3.87–5.11)
RDW: 12.8 % (ref 11.5–15.5)
WBC: 8.9 10*3/uL (ref 4.0–10.5)
nRBC: 0 % (ref 0.0–0.2)

## 2024-01-20 MED ORDER — KETOROLAC TROMETHAMINE 15 MG/ML IJ SOLN
15.0000 mg | Freq: Once | INTRAMUSCULAR | Status: AC
Start: 2024-01-20 — End: 2024-01-20
  Administered 2024-01-20: 15 mg via INTRAMUSCULAR
  Filled 2024-01-20: qty 1

## 2024-01-20 MED ORDER — DIAZEPAM 2 MG PO TABS
2.0000 mg | ORAL_TABLET | Freq: Once | ORAL | Status: AC
  Administered 2024-01-20: 2 mg via ORAL
  Filled 2024-01-20: qty 1

## 2024-01-20 NOTE — ED Provider Notes (Signed)
 [facility] EMERGENCY DEPARTMENT AT [facility] Provider Note   CSN: [id number] Arrival date & time: 01/20/24  9460     History  Chief Complaint  Patient presents with   Influenza    OLAYINKA GATHERS is a 28 y.o. female.   Influenza 28 year old female here for flu.  Patient had a few days of symptoms.  She has had predominantly cough, congestion, some sore throat, headache, fevers.  Some nausea and vomiting as well.  Was seen at [facility] yesterday and diagnosed with flu.  She was given Zofran  and Tamiflu .  Earlier this morning she woke up and felt somewhat shaky.  No seizure-like activity.  She had pain when she coughs that was severe in her chest, and noticed that she had some bleeding around her left eye.  She felt like her left eye was coming gunky today too.  No trauma.  Maybe some slight blurry vision in the left eye.  She is otherwise been at her baseline health.     Home Medications Prior to Admission medications   Medication Sig Start Date End Date Taking? Authorizing Provider  albuterol (PROVENTIL HFA;VENTOLIN HFA) 108 (90 BASE) MCG/ACT inhaler Inhale 2 puffs into the lungs every 6 (six) hours as needed. For shortness of breath    [provider]  cyclobenzaprine  (FLEXERIL ) 5 MG tablet Take 1-2 tablets (5-10 mg total) by mouth 3 (three) times daily as needed for muscle spasms. Not drink alcohol or drive while taking this medication.  May cause drowsiness. 11/12/21   Stuart Vernell Norris, PA-C  ibuprofen  (ADVIL ,MOTRIN ) 800 MG tablet Take 1 tablet (800 mg total) by mouth every 8 (eight) hours as needed. 12/03/17   Charlene Debby BROCKS, PA-C  LamoTRIgine  50 MG TB24 24 hour tablet Take 2 tablets (100 mg total) by mouth in the morning and at bedtime. Take 100 mg total twice a day. If you already have the 50 mg capsules at home, take two capsules twice daily until out, then take this medication. DO NOT take both forms of lamictal . 04/21/21 05/21/21  Angelena Smalls, MD   ondansetron  (ZOFRAN ) 4 MG tablet Take 1 tablet (4 mg total) by mouth every 6 (six) hours. 12/11/12   Rollene Arlean ORN, NP  ondansetron  (ZOFRAN -ODT) 4 MG disintegrating tablet Take 1 tablet (4 mg total) by mouth every 6 (six) hours as needed for nausea or vomiting. 01/19/24   Ward, Josette SAILOR, DO  oseltamivir  (TAMIFLU ) 75 MG capsule Take 1 capsule (75 mg total) by mouth 2 (two) times daily for 5 days. 01/19/24 01/24/24  Ward, Josette SAILOR, DO  predniSONE  (DELTASONE ) 10 MG tablet Take 2 tablets (20 mg total) by mouth daily. 11/12/21   Stuart Vernell Norris, PA-C  SUMAtriptan (IMITREX) 50 MG tablet Take 50 mg by mouth every 2 (two) hours as needed. For migraine    [provider]  topiramate (TOPAMAX) 25 MG tablet Take 25 mg by mouth at bedtime.    [provider]      Allergies    Patient has no known allergies.    Review of Systems   Review of Systems Review of systems completed and notable as per HPI.  ROS otherwise negative.   Physical Exam Updated Vital Signs BP 124/78 (BP Location: Right Arm)   Pulse 80   Temp 98.9 F (37.2 C) (Oral)   Resp 20   LMP 01/12/2024   SpO2 96%  Physical Exam Vitals and nursing note reviewed.  Constitutional:  General: She is not in acute distress.    Appearance: She is well-developed.  HENT:     Head: Normocephalic and atraumatic.     Nose: Nose normal.     Mouth/Throat:     Mouth: Mucous membranes are moist.     Pharynx: Oropharynx is clear. No oropharyngeal exudate or posterior oropharyngeal erythema.  Eyes:     Extraocular Movements: Extraocular movements intact.     Pupils: Pupils are equal, round, and reactive to light.     Comments: Small left inferior subconjunctival hemorrhage.  No pain with eye movement.  Pupils reactive.  No hypopyon or hyphema.  No conjunctival changes otherwise.  Vision intact.  Cardiovascular:     Rate and Rhythm: Normal rate and regular rhythm.     Pulses: Normal pulses.     Heart sounds: Normal  heart sounds. No murmur heard. Pulmonary:     Effort: Pulmonary effort is normal. No respiratory distress.     Breath sounds: Normal breath sounds.  Abdominal:     Palpations: Abdomen is soft.     Tenderness: There is no abdominal tenderness. There is no guarding or rebound.  Musculoskeletal:        General: No swelling.     Cervical back: Neck supple.     Right lower leg: No edema.     Left lower leg: No edema.  Lymphadenopathy:     Cervical: No cervical adenopathy.  Skin:    General: Skin is warm and dry.     Capillary Refill: Capillary refill takes less than 2 seconds.  Neurological:     General: No focal deficit present.     Mental Status: She is alert and oriented to person, place, and time. Mental status is at baseline.  Psychiatric:        Mood and Affect: Mood normal.     ED Results / Procedures / Treatments   Labs (all labs ordered are listed, but only abnormal results are displayed) Labs Reviewed  COMPREHENSIVE METABOLIC PANEL - Abnormal; Notable for the following components:      Result Value   CO2 21 (*)    Glucose, Bld 127 (*)    Calcium 8.5 (*)    Albumin 3.3 (*)    AST 42 (*)    ALT 49 (*)    All other components within normal limits  CBC WITH DIFFERENTIAL/PLATELET    EKG EKG Interpretation Date/Time:  Tuesday January 20 2024 06:11:19 EST Ventricular Rate:  89 PR Interval:  136 QRS Duration:  88 QT Interval:  354 QTC Calculation: 430 R Axis:   41  Text Interpretation: Normal sinus rhythm Low voltage QRS Nonspecific T wave abnormality Abnormal ECG When compared with ECG of 02-Feb-2021 11:04, PREVIOUS ECG IS PRESENT Confirmed by Nicholaus Dolphin [phone]) on 01/20/2024 10:11:22 AM  Radiology DG Chest 2 View Result Date: 01/20/2024 CLINICAL DATA:  flu.  Cough. EXAM: CHEST - 2 VIEW COMPARISON:  Chest XR, 01/19/2024. FINDINGS: Cardiomediastinal silhouette is within normal limits. Lungs are mildly hypoinflated with trace perihilar linear opacities. No focal  consolidation or mass. No pleural effusion or pneumothorax. No acute displaced fracture. IMPRESSION: No acute cardiopulmonary process. Electronically Signed   By: Thom Hall M.D.   On: 01/20/2024 07:43   DG Chest 2 View Result Date: 01/19/2024 CLINICAL DATA:  Cough. EXAM: CHEST - 2 VIEW COMPARISON:  Chest x-ray 12/11/2012. FINDINGS: The heart size and mediastinal contours are within normal limits. Both lungs are clear. No visible pleural effusions or  pneumothorax. No acute osseous abnormality. IMPRESSION: No active cardiopulmonary disease. Electronically Signed   By: Gilmore GORMAN Molt M.D.   On: 01/19/2024 03:33    Procedures Procedures    Medications Ordered in ED Medications  ketorolac  (TORADOL ) 15 MG/ML injection 15 mg (has no administration in time range)  diazepam  (VALIUM ) tablet 2 mg (2 mg Oral Given 01/20/24 9382)    ED Course/ Medical Decision Making/ A&P                                 Medical Decision Making  Medical Decision Making:   KEYAIRRA KOLINSKI is a 28 y.o. female who presented to the ED today with influenza.  Vital signs reviewed.  Exam she is well-appearing.  She received some Valium  earlier which helped significantly.  Her symptoms seem consistent with influenza infection.  She has some chest pain with cough, EKG is nonischemic I have low suspicion for ACS, PE and she is PERC negative.  Lungs are clear.  Chest x-ray reviewed no signs of pneumonia or other acute abnormality.  She is tolerating p.o. and clinically is well-hydrated.  Lab work is reassuring.  She has mild transaminitis likely related to flu infection.  I did discuss this with her including need for recheck with her PCP.  She denies chance for pregnancy, will give her dose of Toradol  to help with her body aches and chills.  She has small left subconjunctival hemorrhage likely due to cough.  No petechiae, throat cytopenia or signs of other bleeding or coagulopathy.  Patient is comfortable with plan for  discharge home.  Strict return precautions given.  She already has Tamiflu  and Zofran  at home.  Reviewed and confirmed nursing documentation for past medical history, family history, social history.  Patient's presentation is most consistent with acute complicated illness / injury requiring diagnostic workup.           Final Clinical Impression(s) / ED Diagnoses Final diagnoses:  Influenza    Rx / DC Orders ED Discharge Orders     None         Nicholaus Cassondra DEL, MD 01/20/24 1013

## 2024-01-20 NOTE — Discharge Instructions (Signed)
 Follow-up with your primary care doctor.  Return for new or worsening symptoms.

## 2024-01-20 NOTE — ED Triage Notes (Addendum)
 Pt seen at [place] yesterday for flu A. Supportive therapy provided and pt discharged. Today she presents with bilateral petechia hemorrhaging due to vomiting. She is hyperventilating saying she feels so bad. Lung sounds diminished. She took 1500 G tylenol  at 0500 before coming in.

## 2024-01-28 ENCOUNTER — Encounter: Payer: Self-pay | Admitting: Family Medicine

## 2024-01-28 ENCOUNTER — Ambulatory Visit (INDEPENDENT_AMBULATORY_CARE_PROVIDER_SITE_OTHER): Payer: 59 | Admitting: Family Medicine

## 2024-01-28 ENCOUNTER — Other Ambulatory Visit: Payer: Self-pay

## 2024-01-28 VITALS — BP 131/88 | HR 75 | Temp 98.0°F | Resp 18 | Ht 61.0 in | Wt 203.0 lb

## 2024-01-28 DIAGNOSIS — G40909 Epilepsy, unspecified, not intractable, without status epilepticus: Secondary | ICD-10-CM | POA: Diagnosis not present

## 2024-01-28 DIAGNOSIS — R7401 Elevation of levels of liver transaminase levels: Secondary | ICD-10-CM

## 2024-01-28 DIAGNOSIS — G43009 Migraine without aura, not intractable, without status migrainosus: Secondary | ICD-10-CM

## 2024-01-28 DIAGNOSIS — J4520 Mild intermittent asthma, uncomplicated: Secondary | ICD-10-CM | POA: Diagnosis not present

## 2024-01-28 DIAGNOSIS — Z0000 Encounter for general adult medical examination without abnormal findings: Secondary | ICD-10-CM

## 2024-01-28 DIAGNOSIS — R051 Acute cough: Secondary | ICD-10-CM | POA: Diagnosis not present

## 2024-01-28 DIAGNOSIS — Z131 Encounter for screening for diabetes mellitus: Secondary | ICD-10-CM

## 2024-01-28 DIAGNOSIS — Z13220 Encounter for screening for lipoid disorders: Secondary | ICD-10-CM

## 2024-01-28 DIAGNOSIS — Z7689 Persons encountering health services in other specified circumstances: Secondary | ICD-10-CM

## 2024-01-28 NOTE — Patient Instructions (Signed)

## 2024-01-28 NOTE — Progress Notes (Signed)
 New Patient Office Visit  Subjective   Patient ID: Julia Brooks, female    DOB: June 13, 1996  Age: 28 y.o. MRN: [id number]  CC:  Chief Complaint  Patient presents with   Follow-up   Influenza   HPI Julia Brooks is a 28 year old female who presents to establish with [facility].   CC: Patient here to establish care  Last PCP: [facility] (?) did not see frequently  Specialists: neurology @ [facility]   Presented to ED on 2/3 for fevers, headache, cough, congestion, sore throat, N/V x2 days. Currently feeling a lot better only reports acute cough- denies wheezing or shortness of breath. L eye- burst blood vessels and still has cough  Denies changes to vision and pain in L eye  Past medical history: Asthma- albuterol PRN, used while she had the flu  Migraines- well controlled, has been years since last one  Seizures- saw [facility]  No longer taking topiramate & Lamictal- felt like a zombie  First one was in high school  2020-2022 occurring more frequently   Frontal-temporal lobe seizures  Has not had any issues for over 2 years   Outpatient Encounter Medications as of 01/28/2024  Medication Sig   albuterol (PROVENTIL HFA;VENTOLIN HFA) 108 (90 BASE) MCG/ACT inhaler Inhale 2 puffs into the lungs every 6 (six) hours as needed. For shortness of breath   ibuprofen (ADVIL,MOTRIN) 800 MG tablet Take 1 tablet (800 mg total) by mouth every 8 (eight) hours as needed.   [DISCONTINUED] ondansetron (ZOFRAN-ODT) 4 MG disintegrating tablet Take 1 tablet (4 mg total) by mouth every 6 (six) hours as needed for nausea or vomiting.   [DISCONTINUED] cyclobenzaprine (FLEXERIL) 5 MG tablet Take 1-2 tablets (5-10 mg total) by mouth 3 (three) times daily as needed for muscle spasms. Not drink alcohol or drive while taking this medication.  May cause drowsiness.   [DISCONTINUED] lamoTRIgine (LAMICTAL XR) 50 MG 24 hour tablet Take 50 mg by mouth 2  (two) times daily.   [DISCONTINUED] lamoTRIgine (LAMICTAL) 150 MG tablet Take 1 tablet by mouth 2 (two) times daily. (Patient not taking: Reported on 01/28/2024)   [DISCONTINUED] LamoTRIgine 50 MG TB24 24 hour tablet Take 2 tablets (100 mg total) by mouth in the morning and at bedtime. Take 100 mg total twice a day. If you already have the 50 mg capsules at home, take two capsules twice daily until out, then take this medication. DO NOT take both forms of lamictal.   [DISCONTINUED] ondansetron (ZOFRAN) 4 MG tablet Take 1 tablet (4 mg total) by mouth every 6 (six) hours.   [DISCONTINUED] predniSONE (DELTASONE) 10 MG tablet Take 2 tablets (20 mg total) by mouth daily.   [DISCONTINUED] SUMAtriptan (IMITREX) 50 MG tablet Take 50 mg by mouth every 2 (two) hours as needed. For migraine   [DISCONTINUED] topiramate (TOPAMAX) 25 MG tablet Take 25 mg by mouth at bedtime.   No facility-administered encounter medications on file as of 01/28/2024.    Patient Active Problem List   Diagnosis Date Noted   Syncope 03/24/2014   Near syncope 03/04/2014   Headache 03/04/2014   Past Medical History:  Diagnosis Date   Asthma    Migraine    Occasional tremors    Syncope and collapse    History reviewed. No pertinent surgical history. Family History  Problem Relation Age of Onset   Hypertension Mother    Gestational diabetes Mother    Hypertension Father  Diabetes Mellitus II Father    Scoliosis Brother    Breast cancer Maternal Aunt        passed @ age 63   Social History   Socioeconomic History   Marital status: Single    Spouse name: Not on file   Number of children: Not on file   Years of education: Not on file   Highest education level: Not on file  Occupational History   Not on file  Tobacco Use   Smoking status: Every Day    Current packs/day: 0.50    Types: Cigarettes    Passive exposure: Current   Smokeless tobacco: Never  Substance and Sexual Activity   Alcohol use: No   Drug  use: No   Sexual activity: Not on file  Other Topics Concern   Not on file  Social History Narrative   Not on file   Social Drivers of Health   Financial Resource Strain: Not on file  Food Insecurity: Not on file  Transportation Needs: Not on file  Physical Activity: Not on file  Stress: Not on file  Social Connections: Not on file  Intimate Partner Violence: Not on file   Outpatient Medications Prior to Visit  Medication Sig Dispense Refill   albuterol (PROVENTIL HFA;VENTOLIN HFA) 108 (90 BASE) MCG/ACT inhaler Inhale 2 puffs into the lungs every 6 (six) hours as needed. For shortness of breath     ibuprofen (ADVIL,MOTRIN) 800 MG tablet Take 1 tablet (800 mg total) by mouth every 8 (eight) hours as needed. 30 tablet 0   ondansetron (ZOFRAN-ODT) 4 MG disintegrating tablet Take 1 tablet (4 mg total) by mouth every 6 (six) hours as needed for nausea or vomiting. 20 tablet 0   cyclobenzaprine (FLEXERIL) 5 MG tablet Take 1-2 tablets (5-10 mg total) by mouth 3 (three) times daily as needed for muscle spasms. Not drink alcohol or drive while taking this medication.  May cause drowsiness. 20 tablet 0   lamoTRIgine (LAMICTAL XR) 50 MG 24 hour tablet Take 50 mg by mouth 2 (two) times daily.     lamoTRIgine (LAMICTAL) 150 MG tablet Take 1 tablet by mouth 2 (two) times daily. (Patient not taking: Reported on 01/28/2024)     LamoTRIgine 50 MG TB24 24 hour tablet Take 2 tablets (100 mg total) by mouth in the morning and at bedtime. Take 100 mg total twice a day. If you already have the 50 mg capsules at home, take two capsules twice daily until out, then take this medication. DO NOT take both forms of lamictal. 120 tablet 0   ondansetron (ZOFRAN) 4 MG tablet Take 1 tablet (4 mg total) by mouth every 6 (six) hours. 12 tablet 0   predniSONE (DELTASONE) 10 MG tablet Take 2 tablets (20 mg total) by mouth daily. 15 tablet 0   SUMAtriptan (IMITREX) 50 MG tablet Take 50 mg by mouth every 2 (two) hours as  needed. For migraine     topiramate (TOPAMAX) 25 MG tablet Take 25 mg by mouth at bedtime.     No facility-administered medications prior to visit.   No Known Allergies  ROS: see HPI  Objective   Today's Vitals   01/28/24 0832  BP: 131/88  Pulse: 75  Resp: 18  Temp: 98 F (36.7 C)  TempSrc: Oral  SpO2: 95%  Weight: 203 lb (92.1 kg)  Height: 5\' 1"  (1.549 m)  PainSc: 0-No pain    GENERAL: Well-appearing, in NAD. Well nourished.  SKIN: Pink, warm and  dry. No rash, lesion, ulceration, or ecchymoses.  Head: Normocephalic. NECK: Trachea midline. Full ROM w/o pain or tenderness. No lymphadenopathy.  EARS: Tympanic membranes are intact, translucent without bulging and without drainage. Appropriate landmarks visualized.  EYES: Small left inferior subconjunctival hemorrhage. No pain with eye movement or vision changes. Conjunctiva clear without exudates. EOMI, PERRL, no drainage present. Vision intact.  NOSE: Septum midline w/o deformity. Nares patent, mucosa pink and non-inflamed w/o drainage. No sinus tenderness.  THROAT: Uvula midline. Oropharynx clear. Tonsils non-inflamed without exudate. Mucous membranes pink and moist.  RESPIRATORY: Chest wall symmetrical. Respirations even and non-labored. Breath sounds clear to auscultation bilaterally.  CARDIAC: S1, S2 present, regular rate and rhythm without murmur or gallops. Peripheral pulses 2+ bilaterally.  MSK: Muscle tone and strength appropriate for age. Joints w/o tenderness, redness, or swelling.  EXTREMITIES: Without clubbing, cyanosis, or edema.  NEUROLOGIC: No motor or sensory deficits. Steady, even gait. C2-C12 intact.  PSYCH/MENTAL STATUS: Alert, oriented x 3. Cooperative, appropriate mood and affect.     Assessment & Plan:   1. Encounter to establish care (Primary) Patient is a 76- year-old female who presents today to establish care with primary care at [facility]. Reviewed the past medical history, family history, social  history, surgical history, medications and allergies today- updates made as indicated.   2. Acute cough Patient presents today for hospital follow-up 2/3-2/4 for influenza. Patient in no acute distress and is well-appearing. Denies chest pain, shortness of breath, wheezing, fever/chills, loss of appetite. Cardiovascular exam with heart regular rate and rhythm. Normal heart sounds, no murmurs present. No lower extremity edema present. Lungs clear to auscultation bilaterally. Provided patient with reassurance that this should continue to improve. Return to office sooner than scheduled sooner appointment if symptoms persist or worsen.   3. Transaminitis Review of labs from 2/4 with AST of 42 and ALT of 49. Will repeat CMP today.  - Comprehensive metabolic panel  4. Screening for lipid disorders Will complete fasting labs today. - Lipid panel  5. Screening for diabetes mellitus (DM) Will complete with fasting labs today. Family history of diabetes and current BMI 38.36.  - Hemoglobin A1c  6. Wellness examination Plan to complete physical in 6-8 weeks.  - CBC with Differential/Platelet - Comprehensive metabolic panel - Hemoglobin A1c - Lipid panel - TSH Rfx on Abnormal to Free T4  7. Mild intermittent asthma without complication Chronic. Reports asthma is well controlled. No refill needed today for albuterol.   8. Migraine without aura and without status migrainosus, not intractable Chronic. Well controlled on OTC medications.   9. Seizure disorder [facility]) Patient was diagnosed with frontal-temporal lobe seizures around 2020-2022. She reports she was prescribed topiramate and Lamictal and is no longer taking these medications. She reports "feeling like a zombie on those medications." She reports no issues within the past 2 years. Advised patient to reach out if seizures occur again.   Return in about 8 weeks (around 03/24/2024) for Physical.   Alyson Reedy, FNP

## 2024-01-29 ENCOUNTER — Other Ambulatory Visit: Payer: Self-pay | Admitting: Family Medicine

## 2024-01-29 LAB — COMPREHENSIVE METABOLIC PANEL
ALT: 243 [IU]/L — ABNORMAL HIGH (ref 0–32)
AST: 66 [IU]/L — ABNORMAL HIGH (ref 0–40)
Albumin: 4.2 g/dL (ref 4.0–5.0)
Alkaline Phosphatase: 110 [IU]/L (ref 44–121)
BUN/Creatinine Ratio: 11 (ref 9–23)
BUN: 9 mg/dL (ref 6–20)
Bilirubin Total: <0.2 mg/dL (ref 0.0–1.2)
CO2: 20 mmol/L (ref 20–29)
Calcium: 9.4 mg/dL (ref 8.7–10.2)
Chloride: 105 mmol/L (ref 96–106)
Creatinine, Ser: 0.8 mg/dL (ref 0.57–1.00)
Globulin, Total: 2.9 g/dL (ref 1.5–4.5)
Glucose: 95 mg/dL (ref 70–99)
Potassium: 4.3 mmol/L (ref 3.5–5.2)
Sodium: 139 mmol/L (ref 134–144)
Total Protein: 7.1 g/dL (ref 6.0–8.5)
eGFR: 104 mL/min/{1.73_m2} (ref >59)

## 2024-01-29 LAB — LIPID PANEL
Chol/HDL Ratio: 4.4 {ratio} (ref 0.0–4.4)
Cholesterol, Total: 184 mg/dL (ref 100–199)
HDL: 42 mg/dL (ref >39)
LDL Chol Calc (NIH): 124 mg/dL — ABNORMAL HIGH (ref 0–99)
Triglycerides: 96 mg/dL (ref 0–149)
VLDL Cholesterol Cal: 18 mg/dL (ref 5–40)

## 2024-01-29 LAB — CBC WITH DIFFERENTIAL/PLATELET
Basophils Absolute: 0 10*3/uL (ref 0.0–0.2)
Basos: 1 %
EOS (ABSOLUTE): 0.3 10*3/uL (ref 0.0–0.4)
Eos: 5 %
Hematocrit: 46 % (ref 34.0–46.6)
Hemoglobin: 15 g/dL (ref 11.1–15.9)
Immature Grans (Abs): 0.1 10*3/uL (ref 0.0–0.1)
Immature Granulocytes: 2 %
Lymphocytes Absolute: 2 10*3/uL (ref 0.7–3.1)
Lymphs: 29 %
MCH: 30.5 pg (ref 26.6–33.0)
MCHC: 32.6 g/dL (ref 31.5–35.7)
MCV: 94 fL (ref 79–97)
Monocytes Absolute: 0.7 10*3/uL (ref 0.1–0.9)
Monocytes: 10 %
Neutrophils Absolute: 3.8 10*3/uL (ref 1.4–7.0)
Neutrophils: 53 %
Platelets: 374 10*3/uL (ref 150–450)
RBC: 4.91 x10E6/uL (ref 3.77–5.28)
RDW: 12.3 % (ref 11.7–15.4)
WBC: 7.1 10*3/uL (ref 3.4–10.8)

## 2024-01-29 LAB — HEMOGLOBIN A1C
Est. average glucose Bld gHb Est-mCnc: 117 mg/dL
Hgb A1c MFr Bld: 5.7 % — ABNORMAL HIGH (ref 4.8–5.6)

## 2024-01-29 LAB — TSH RFX ON ABNORMAL TO FREE T4: TSH: 1.16 u[IU]/mL (ref 0.450–4.500)

## 2024-01-30 LAB — SPECIMEN STATUS REPORT

## 2024-01-30 LAB — HCV INTERPRETATION

## 2024-01-30 LAB — ACUTE VIRAL HEPATITIS (HAV, HBV, HCV)
HCV Ab: NONREACTIVE
Hep A IgM: NEGATIVE
Hep B C IgM: NEGATIVE
Hepatitis B Surface Ag: NEGATIVE

## 2024-02-03 ENCOUNTER — Encounter: Payer: Self-pay | Admitting: Family Medicine

## 2024-02-03 DIAGNOSIS — R7303 Prediabetes: Secondary | ICD-10-CM

## 2024-03-11 ENCOUNTER — Encounter: Payer: 59 | Attending: Family Medicine | Admitting: Dietician

## 2024-03-11 ENCOUNTER — Encounter: Payer: Self-pay | Admitting: Dietician

## 2024-03-11 VITALS — Ht 61.0 in | Wt 203.9 lb

## 2024-03-11 DIAGNOSIS — Z6838 Body mass index (BMI) 38.0-38.9, adult: Secondary | ICD-10-CM | POA: Diagnosis not present

## 2024-03-11 DIAGNOSIS — Z713 Dietary counseling and surveillance: Secondary | ICD-10-CM | POA: Diagnosis not present

## 2024-03-11 DIAGNOSIS — R7303 Prediabetes: Secondary | ICD-10-CM | POA: Insufficient documentation

## 2024-03-11 NOTE — Patient Instructions (Addendum)
 Start your day with a combination of carbs (fruit, toast, cereal) and protein (eggs, [place] sausage or bacon, greek yogurt, protein shake).  Work towards eating three meals a day, about 5-6 hours apart!  Begin to recognize carbohydrates, proteins, and non-starchy vegetables in your food choices!  Begin to build your meals using the proportions of the Balanced Plate. First, select your carb choice(s) for the meal, and portion out about 1 fist size. Make this 25% of your meal. Next, select your source of protein to pair with your carb choice(s). Make this another 25% of your meal. Finally, complete your meal with a variety of non-starchy vegetables. Make this the remaining 50% of your meal.  Increase your physical activity as much as possible, especially resistance training!

## 2024-03-11 NOTE — Progress Notes (Signed)
 Medical Nutrition Therapy  Appointment Start time:  1010  Appointment End time:  1125  Primary concerns today: Prediabetes  Referral diagnosis: R73.03 - Prediabetes Preferred learning style: No preference indicated Learning readiness: Ready   NUTRITION ASSESSMENT   Anthropometrics: Ht: 61" Wt: 203.9 lbs BMI: 38.53 kg/m2  Clinical Medical Hx: Asthma, Medications: N/A Labs: A1c - 5.7%, AST - 66, ALT - 243, LDL - 124 Notable Signs/Symptoms: N/A   Lifestyle & Dietary Hx Pt reports strong family history of diabetes in both parents, states they are very concerned about becoming diabetic, fears of the health complications. Pt reports history of weight fluctuations, states they notice weight gain fairly quickly when not exercising. Pt reports history of going to the gym, sustained ankle injury about a year ago that has lingered until about a month ago, gained ~45 lbs in the last 6 months since stopping going to the gym. Pt reports working as a [employer], states their work schedule varies greatly, this makes it difficult to develop a consistent meal schedule.Pt reports low energy levels recently, finds themselves sleeping in more. Pt reports usually skipping breakfast, first meal is usually before their work shift, may eat a meal at work.    Estimated daily fluid intake: 96 oz Supplements: None currently. Sleep: Sleeps well. Stress / self-care: 6/10, Can linger Current average weekly physical activity: ADLs   24-Hr Dietary Recall First Meal: No breakfast Snack:  Second Meal: [employer] 10 piece nuggets, a few fries, diet coke Snack:  Third Meal: Ground [place], rice, mac and cheese, diet coke Snack:  Beverages: Diet coke, Water, Circul    NUTRITION DIAGNOSIS  NB-1.1 Food and nutrition-related knowledge deficit As related to prediabetes.  As evidenced by A1c of 5.7%, inconsistent meal pattern, no prior nutrition education.   NUTRITION INTERVENTION  Nutrition  education (E-1) on the following topics:  Educated patient on the pathophysiology of diabetes. This includes why our bodies need circulating blood sugar, the relationship between insulin and blood sugar, and the results of insulin resistance and/or pancreatic insufficiency on the development of diabetes. Educated patient on factors that contribute to elevation of blood sugars, such as stress, illness, injury,and food choices. Discussed the role that physical activity plays in lowering blood sugar. Educate patient on the three main macronutrients. Protein, fats, and carbohydrates. Discussed how each of these macronutrients affect blood sugar levels, especially carbohydrate, and the importance of eating a consistent amount of carbohydrate throughout the day.    Handouts Provided Include  Balanced Plate  Learning Style & Readiness for Change Teaching method utilized: Visual & Auditory  Demonstrated degree of understanding via: Teach Back  Barriers to learning/adherence to lifestyle change: None  Goals Established by Pt Start your day with a combination of carbs (fruit, toast, cereal) and protein (eggs, [place] sausage or bacon, greek yogurt, protein shake). Work towards eating three meals a day, about 5-6 hours apart! Begin to recognize carbohydrates, proteins, and non-starchy vegetables in your food choices! Begin to build your meals using the proportions of the Balanced Plate. First, select your carb choice(s) for the meal, and portion out about 1 fist size. Make this 25% of your meal. Next, select your source of protein to pair with your carb choice(s). Make this another 25% of your meal. Finally, complete your meal with a variety of non-starchy vegetables. Make this the remaining 50% of your meal. Increase your physical activity as much as possible, especially resistance training!   MONITORING & EVALUATION Dietary intake, weekly physical activity,  and weight change in 2 months.  Next Steps   Patient is to follow up with RD.

## 2024-03-24 ENCOUNTER — Encounter: Payer: 59 | Admitting: Family Medicine

## 2024-03-25 ENCOUNTER — Ambulatory Visit: Payer: Self-pay | Admitting: Family Medicine

## 2024-03-25 ENCOUNTER — Ambulatory Visit: Payer: Self-pay

## 2024-03-25 NOTE — Telephone Encounter (Signed)
 Chief Complaint: Blood in stool  Frequency: 3-4 times total since Tuesday night  Pertinent Negatives: Patient denies constipation, diarrhea, abdomen pain, dizziness, vomiting, fever  Disposition:  [x] Appointment (In office)  Additional Notes: This RN recommends pt is seen within 24 hours. Pt states she is at work and needs to make a few phone calls and will call back to schedule appointment.   Copied from CRM [id number]. Topic: Clinical - Red Word Triage >> Mar 25, 2024 11:11 AM Fredrich Romans wrote: Red Word that prompted transfer to Nurse Triage: blood in stool x 2 days Reason for Disposition  MODERATE rectal bleeding (small blood clots, passing blood without stool, or toilet water turns red)  Answer Assessment - Initial Assessment Questions APPEARANCE of BLOOD: "What color is it?" "Is it passed separately, on the surface of the stool, or mixed in with the stool?"      Bright red AMOUNT: "How much blood was passed?"      A little bigger than a quarter FREQUENCY: "How many times has blood been passed with the stools?"      3-4 ONSET: "When was the blood first seen in the stools?" (Days or weeks)      Tuesday night DIARRHEA: "Is there also some diarrhea?" If Yes, ask: "How many diarrhea stools in the past 24 hours?"      Denies CONSTIPATION: "Do you have constipation?" If Yes, ask: "How bad is it?"     Denies RECURRENT SYMPTOMS: "Have you had blood in your stools before?" If Yes, ask: "When was the last time?" and "What happened that time?"      Denies BLOOD THINNERS: "Do you take any blood thinners?" (e.g., Coumadin/warfarin, Pradaxa/dabigatran, aspirin)     Denies OTHER SYMPTOMS: "Do you have any other symptoms?"  (e.g., abdomen pain, vomiting, dizziness, fever)     Denies PREGNANCY: "Is there any chance you are pregnant?" "When was your last menstrual period?"       Denies  Protocols used: Rectal Bleeding-A-AH

## 2024-03-25 NOTE — Telephone Encounter (Signed)
 Pt triaged today for blood in stool, was told to be seen within 24 hours. Pt calling in asking which UC to go to. RN advised closest UC. Pt confirms no other questions.

## 2024-04-01 ENCOUNTER — Other Ambulatory Visit (HOSPITAL_COMMUNITY)
Admission: RE | Admit: 2024-04-01 | Discharge: 2024-04-01 | Disposition: A | Discharge status: Home / Self Care | Source: Ambulatory Visit | Attending: Family Medicine | Admitting: Family Medicine

## 2024-04-01 ENCOUNTER — Ambulatory Visit (INDEPENDENT_AMBULATORY_CARE_PROVIDER_SITE_OTHER): Admitting: Family Medicine

## 2024-04-01 ENCOUNTER — Encounter: Payer: Self-pay | Admitting: Family Medicine

## 2024-04-01 VITALS — BP 123/84 | HR 79 | Temp 98.1°F | Resp 16 | Ht 60.63 in | Wt 203.2 lb

## 2024-04-01 DIAGNOSIS — Z124 Encounter for screening for malignant neoplasm of cervix: Secondary | ICD-10-CM | POA: Insufficient documentation

## 2024-04-01 DIAGNOSIS — R7989 Other specified abnormal findings of blood chemistry: Secondary | ICD-10-CM

## 2024-04-01 DIAGNOSIS — Z0000 Encounter for general adult medical examination without abnormal findings: Secondary | ICD-10-CM

## 2024-04-01 DIAGNOSIS — N6315 Unspecified lump in the right breast, overlapping quadrants: Secondary | ICD-10-CM | POA: Diagnosis not present

## 2024-04-01 NOTE — Patient Instructions (Signed)
 Health Maintenance Recommendations Screening Testing Mammogram Every 1 -2 years based on history and risk factors Starting at age 27 Pap Smear Ages 21-39 every 3 years Ages 23-65 every 5 years with HPV testing More frequent testing may be required based on results and history Colon Cancer Screening Every 1-10 years based on test performed, risk factors, and history Starting at age 102 Bone Density Screening Every 2-10 years based on history Starting at age 69 for women Recommendations for men differ based on medication usage, history, and risk factors AAA Screening One time ultrasound Men 30-10 years old who have every smoked Lung Cancer Screening Low Dose Lung CT every 12 months Age 20-80 years with a 30 pack-year smoking history who still smoke or who have quit within the last 15 years   Screening Labs Routine  Labs: Complete Blood Count (CBC), Complete Metabolic Panel (CMP), Cholesterol (Lipid Panel) Every 6-12 months based on history and medications May be recommended more frequently based on current conditions or previous results Hemoglobin A1c Lab Every 3-12 months based on history and previous results Starting at age 24 or earlier with diagnosis of diabetes, high cholesterol, BMI >26, and/or risk factors Frequent monitoring for patients with diabetes to ensure blood sugar control Thyroid Panel (TSH w/ T3 & T4) Every 6 months based on history, symptoms, and risk factors May be repeated more often if on medication HIV One time testing for all patients 23 and older May be repeated more frequently for patients with increased risk factors or exposure Hepatitis C One time testing for all patients 47 and older May be repeated more frequently for patients with increased risk factors or exposure Gonorrhea, Chlamydia Every 12 months for all sexually active persons 13-24 years Additional monitoring may be recommended for those who are considered high risk or who have  symptoms PSA Men 72-66 years old with risk factors Additional screening may be recommended from age 2-69 based on risk factors, symptoms, and history   Vaccine Recommendations Tetanus Booster All adults every 10 years Flu Vaccine All patients 6 months and older every year COVID Vaccine All patients 12 years and older Initial dosing with booster May recommend additional booster based on age and health history HPV Vaccine 2 doses all patients age 56-26 Dosing may be considered for patients over 26 Shingles Vaccine (Shingrix) 2 doses all adults 55 years and older Pneumonia (Pneumovax 23) All adults 65 years and older May recommend earlier dosing based on health history Pneumonia (Prevnar 16) All adults 65 years and older Dosed 1 year after Pneumovax 23   Additional Screening, Testing, and Vaccinations may be recommended on an individualized basis based on family history, health history, risk factors, and/or exposure.  __________________________________________________________   Diet Recommendations for All Patients   I recommend that all patients maintain a diet low in saturated fats, carbohydrates, and cholesterol. While this can be challenging at first, it is not impossible and small changes can make big differences.  Things to try: Decreasing the amount of soda, sweet tea, and/or juice to one or less per day and replace with water While water is always the first choice, if you do not like water you may consider adding a water additive without sugar to improve the taste other sugar free drinks Replace potatoes with a brightly colored vegetable at dinner Use healthy oils, such as canola oil or olive oil, instead of butter or hard margarine Limit your bread intake to two pieces or less a day Replace regular pasta with  low carb pasta options Bake, broil, or grill foods instead of frying Monitor portion sizes  Eat smaller, more frequent meals throughout the day instead of large  meals   An important thing to remember is, if you love foods that are not great for your health, you don't have to give them up completely. Instead, allow these foods to be a reward when you have done well. Allowing yourself to still have special treats every once in a while is a nice way to tell yourself thank you for working hard to keep yourself healthy.    Also remember that every day is a new day. If you have a bad day and "fall off the wagon", you can still climb right back up and keep moving along on your journey!   We have resources available to help you!  Some websites that may be helpful include: www.http://www.wall-moore.info/        Www.VeryWellFit.com _____________________________________________________________   Activity Recommendations for All Patients   I recommend that all adults get at least 20 minutes of moderate physical activity that elevates your heart rate at least 5 days out of the week.  Some examples include: Walking or jogging at a pace that allows you to carry on a conversation Cycling (stationary bike or outdoors) Water aerobics Yoga Weight lifting Dancing If physical limitations prevent you from putting stress on your joints, exercise in a pool or seated in a chair are excellent options.   Do determine your MAXIMUM heart rate for activity: YOUR AGE - 220 = MAX HeartRate    Remember! Do not push yourself too hard.  Start slowly and build up your pace, speed, weight, time in exercise, etc.  Allow your body to rest between exercise and get good sleep. You will need more water than normal when you are exerting yourself. Do not wait until you are thirsty to drink. Drink with a purpose of getting in at least 8, 8 ounce glasses of water a day plus more depending on how much you exercise and sweat.      If you begin to develop dizziness, chest pain, abdominal pain, jaw pain, shortness of breath, headache, vision changes, lightheadedness, or other concerning symptoms, stop the  activity and allow your body to rest. If your symptoms are severe, seek emergency evaluation immediately. If your symptoms are concerning, but not severe, please let us know so that we can recommend further evaluation.

## 2024-04-01 NOTE — Progress Notes (Signed)
 Subjective:   Julia Brooks Nov 29, 1996  04/01/2024   CC: Chief Complaint  Patient presents with   Annual Exam    Here for annual physical. Never had a papsmear. Due for STD screenings. Only been sexually active with women. Aunt died at 33 from breast cancer. Would like to see if a mammogram could be ordered. Mom has a lump on breast she has to get checked often.     HPI: Julia Brooks is a 28 y.o. female who presents for a routine health maintenance exam.  Labs at previous visit.   Exercise: started going back to the gym, active at work, ankle injury in the past  Diet: has been better, has seen nutrition counseling  Work makes a steady routine difficult   HEALTH SCREENINGS: - Vision Screening:  plans to schedule , no issues with vision  - Dental Visits:  plans to schedule  - Pap smear: pap done - Breast Exam:  done today - STD Screening: Declined - Mammogram (40+): Not applicable  - Colonoscopy (45+): Not applicable  - Bone Density (65+ or under 65 with predisposing conditions): Not applicable  - Lung CA screening with low-dose CT:  Not applicable Adults age 60-80 who are current cigarette smokers or quit within the last 15 years. Must have 20 pack year history.   Depression and Anxiety Screen done today and results listed below:     04/01/2024    8:59 AM 01/28/2024    8:52 AM  Depression screen PHQ 2/9  Decreased Interest 1 3  Down, Depressed, Hopeless 2 2  PHQ - 2 Score 3 5  Altered sleeping 2 1  Tired, decreased energy 3 3  Change in appetite 1 2  Feeling bad or failure about yourself  0 1  Trouble concentrating 1 1  Moving slowly or fidgety/restless 0 0  Suicidal thoughts 0 0  PHQ-9 Score 10 13  Difficult doing work/chores Very difficult Very difficult      04/01/2024    8:59 AM 01/28/2024    8:52 AM  GAD 7 : Generalized Anxiety Score  Nervous, Anxious, on Edge 0 0  Control/stop worrying 1 0  Worry too much - different things 1 0  Trouble  relaxing 0 2  Restless 0 0  Easily annoyed or irritable 1 0  Afraid - awful might happen 0 0  Total GAD 7 Score 3 2  Anxiety Difficulty Not difficult at all Not difficult at all    IMMUNIZATIONS: - Tdap: Tetanus vaccination status reviewed: last tetanus booster within 10 years. - HPV: Up to date - Influenza:  N/A - Pneumovax: Not applicable - Prevnar 20: Not applicable - Zostavax (50+): Not applicable   Past medical history, surgical history, medications, allergies, family history and social history reviewed with patient today and changes made to appropriate areas of the chart.   Past Medical History:  Diagnosis Date   Asthma    Internal hemorrhoids    Migraine    Occasional tremors    Syncope and collapse     History reviewed. No pertinent surgical history.  No current outpatient medications on file prior to visit.   No current facility-administered medications on file prior to visit.    No Known Allergies   Social History   Socioeconomic History   Marital status: Single    Spouse name: Not on file   Number of children: 0   Years of education: Not on file   Highest education level: Not on  file  Occupational History   Not on file  Tobacco Use   Smoking status: Former    Current packs/day: 0.00    Average packs/day: 0.5 packs/day for 4.0 years (2.0 ttl pk-yrs)    Types: Cigarettes    Start date: 2016    Quit date: 2020    Years since quitting: 5.2    Passive exposure: Current   Smokeless tobacco: Never  Vaping Use   Vaping status: Every Day   Substances: Nicotine  Substance and Sexual Activity   Alcohol use: No   Drug use: No   Sexual activity: Not on file  Other Topics Concern   Not on file  Social History Narrative   Not on file   Social Drivers of Health   Financial Resource Strain: Not on file  Food Insecurity: No Food Insecurity (04/01/2024)   Hunger Vital Sign    Worried About Running Out of Food in the Last Year: Never true    Ran Out  of Food in the Last Year: Never true  Transportation Needs: No Transportation Needs (04/01/2024)   PRAPARE - Administrator, Civil Service (Medical): No    Lack of Transportation (Non-Medical): No  Physical Activity: Not on file  Stress: Not on file  Social Connections: Not on file  Intimate Partner Violence: Not At Risk (04/01/2024)   Humiliation, Afraid, Rape, and Kick questionnaire    Fear of Current or Ex-Partner: No    Emotionally Abused: No    Physically Abused: No    Sexually Abused: No   Social History   Tobacco Use  Smoking Status Former   Current packs/day: 0.00   Average packs/day: 0.5 packs/day for 4.0 years (2.0 ttl pk-yrs)   Types: Cigarettes   Start date: 2016   Quit date: 2020   Years since quitting: 5.2   Passive exposure: Current  Smokeless Tobacco Never   Social History   Substance and Sexual Activity  Alcohol Use No    Family History  Problem Relation Age of Onset   Hypertension Mother    Gestational diabetes Mother    Fibrocystic breast disease Mother        breast lump @ ?   Hypertension Father    Diabetes Mellitus II Father    Scoliosis Brother    Scoliosis Brother    Breast cancer Maternal Aunt        passed @ age 93     ROS: Denies fever, fatigue, unexplained weight loss/gain, chest pain, SHOB, and palpitations. Denies neurological deficits, gastrointestinal or genitourinary complaints, and skin changes.   Objective:   Today's Vitals   04/01/24 0847  BP: 123/84  Pulse: 79  Resp: 16  Temp: 98.1 F (36.7 C)  TempSrc: Oral  SpO2: 97%  Weight: 203 lb 3.2 oz (92.2 kg)  Height: 5' 0.63" (1.54 m)  PainSc: 0-No pain    GENERAL APPEARANCE: Well-appearing, in NAD. Well nourished.  SKIN: Pink, warm and dry. Turgor normal. No rash, lesion, ulceration, or ecchymoses. Hair evenly distributed.  HEENT: HEAD: Normocephalic.  EYES: PERRLA. EOMI. Lids intact w/o defect. Sclera white, Conjunctiva pink w/o exudate.  EARS: External  ear w/o redness, swelling, masses or lesions. EAC clear. TM's intact, translucent w/o bulging, appropriate landmarks visualized. Appropriate acuity to conversational tones.  NOSE: Septum midline w/o deformity. Nares patent, mucosa pink and non-inflamed w/o drainage. No sinus tenderness.  THROAT: Uvula midline. Oropharynx clear. Tonsils non-inflamed w/o exudate. Oral mucosa pink and moist.  NECK: Supple, Trachea  midline. Full ROM w/o pain or tenderness. No lymphadenopathy. Thyroid non-tender w/o enlargement or palpable masses.  BREASTS: Breasts pendulous, symmetrical, and w/o palpable masses. Nipples everted and w/o discharge. No rash or skin retraction. No axillary or supraclavicular lymphadenopathy.  RESPIRATORY: Chest wall symmetrical w/o masses. Respirations even and non-labored. Breath sounds clear to auscultation bilaterally. No wheezes, rales, rhonchi, or crackles. CARDIAC: S1, S2 present, regular rate and rhythm. No gallops, murmurs, rubs, or clicks. PMI w/o lifts, heaves, or thrills. No carotid bruits. Capillary refill <2 seconds. Peripheral pulses 2+ bilaterally. GI: Abdomen soft w/o distention. Normoactive bowel sounds. No palpable masses or tenderness. No guarding or rebound tenderness. Liver and spleen w/o tenderness or enlargement. No CVA tenderness.  GU: External genitalia without erythema, lesions, or masses. No lymphadenopathy. Vaginal mucosa pink and moist without exudate, lesions, or ulcerations. Cervix pink without discharge. Cervical os closed. Uterus and adnexae palpable, not enlarged, and w/o tenderness. No palpable masses.  MSK: Muscle tone and strength appropriate for age, w/o atrophy or abnormal movement.  EXTREMITIES: Active ROM intact, w/o tenderness, crepitus, or contracture. No obvious joint deformities or effusions. No clubbing, edema, or cyanosis.  NEUROLOGIC: CN's II-XII intact. Motor strength symmetrical with no obvious weakness. No sensory deficits. DTR's 2+ symmetric  bilaterally. Steady, even gait.  PSYCH/MENTAL STATUS: Alert, oriented x 3. Cooperative, appropriate mood and affect.   Chaperoned by Marybelle Killings, CMA   Results for orders placed or performed in visit on 01/28/24  CBC with Differential/Platelet   Collection Time: 01/28/24  9:20 AM  Result Value Ref Range   WBC 7.1 3.4 - 10.8 x10E3/uL   RBC 4.91 3.77 - 5.28 x10E6/uL   Hemoglobin 15.0 11.1 - 15.9 g/dL   Hematocrit 16.1 09.6 - 46.6 %   MCV 94 79 - 97 fL   MCH 30.5 26.6 - 33.0 pg   MCHC 32.6 31.5 - 35.7 g/dL   RDW 04.5 40.9 - 81.1 %   Platelets 374 150 - 450 x10E3/uL   Neutrophils 53 Not Estab. %   Lymphs 29 Not Estab. %   Monocytes 10 Not Estab. %   Eos 5 Not Estab. %   Basos 1 Not Estab. %   Neutrophils Absolute 3.8 1.4 - 7.0 x10E3/uL   Lymphocytes Absolute 2.0 0.7 - 3.1 x10E3/uL   Monocytes Absolute 0.7 0.1 - 0.9 x10E3/uL   EOS (ABSOLUTE) 0.3 0.0 - 0.4 x10E3/uL   Basophils Absolute 0.0 0.0 - 0.2 x10E3/uL   Immature Granulocytes 2 Not Estab. %   Immature Grans (Abs) 0.1 0.0 - 0.1 x10E3/uL  Comprehensive metabolic panel   Collection Time: 01/28/24  9:20 AM  Result Value Ref Range   Glucose 95 70 - 99 mg/dL   BUN 9 6 - 20 mg/dL   Creatinine, Ser 9.14 0.57 - 1.00 mg/dL   eGFR 782 >95 AO/ZHY/8.65   BUN/Creatinine Ratio 11 9 - 23   Sodium 139 134 - 144 mmol/L   Potassium 4.3 3.5 - 5.2 mmol/L   Chloride 105 96 - 106 mmol/L   CO2 20 20 - 29 mmol/L   Calcium 9.4 8.7 - 10.2 mg/dL   Total Protein 7.1 6.0 - 8.5 g/dL   Albumin 4.2 4.0 - 5.0 g/dL   Globulin, Total 2.9 1.5 - 4.5 g/dL   Bilirubin Total <7.8 0.0 - 1.2 mg/dL   Alkaline Phosphatase 110 44 - 121 IU/L   AST 66 (H) 0 - 40 IU/L   ALT 243 (H) 0 - 32 IU/L  Hemoglobin A1c  Collection Time: 01/28/24  9:20 AM  Result Value Ref Range   Hgb A1c MFr Bld 5.7 (H) 4.8 - 5.6 %   Est. average glucose Bld gHb Est-mCnc 117 mg/dL  Lipid panel   Collection Time: 01/28/24  9:20 AM  Result Value Ref Range   Cholesterol, Total 184  100 - 199 mg/dL   Triglycerides 96 0 - 149 mg/dL   HDL 42 >16 mg/dL   VLDL Cholesterol Cal 18 5 - 40 mg/dL   LDL Chol Calc (NIH) 109 (H) 0 - 99 mg/dL   Chol/HDL Ratio 4.4 0.0 - 4.4 ratio  TSH Rfx on Abnormal to Free T4   Collection Time: 01/28/24  9:20 AM  Result Value Ref Range   TSH 1.160 0.450 - 4.500 uIU/mL  Acute Viral Hepatitis (HAV, HBV, HCV)   Collection Time: 01/28/24  9:20 AM  Result Value Ref Range   Hep A IgM Negative Negative   Hepatitis B Surface Ag Negative Negative   Hep B C IgM Negative Negative   HCV Ab Non Reactive Non Reactive  Interpretation:   Collection Time: 01/28/24  9:20 AM  Result Value Ref Range   HCV Interp 1: Comment   Specimen status report   Collection Time: 01/28/24  9:20 AM  Result Value Ref Range   specimen status report Comment     Assessment & Plan:   1. Wellness examination (Primary) - Encouraged a healthy well-balanced diet. Patient may adjust caloric intake to maintain or achieve ideal body weight. May reduce intake of dietary saturated fat and total fat and have adequate dietary potassium and calcium preferably from fresh fruits, vegetables, and low-fat dairy products.   - Advised to avoid cigarette smoking. - Discussed with the patient that most people either abstain from alcohol or drink within safe limits (<=14/week and <=4 drinks/occasion for males, <=7/weeks and <= 3 drinks/occasion for females) and that the risk for alcohol disorders and other health effects rises proportionally with the number of drinks per week and how often a drinker exceeds daily limits. - Discussed cessation/primary prevention of drug use and availability of treatment for abuse.  - Discussed sexually transmitted diseases, avoidance of unintended pregnancy and contraceptive alternatives. - Stressed the importance of regular exercise - Injury prevention: Discussed safety belts, safety helmets, smoke detector, smoking near bedding or upholstery.  - Dental health:  Discussed importance of regular tooth brushing, flossing, and dental visits.   2. Elevated LFTs Review of previous labs with elevated AST and ALT, will repeat liver function tests today. Denies abdominal pain, nausea/vomiting, change in urine output and color, jaundice, itching of skin. Will update patient with results.  - Comprehensive metabolic panel with GFR  3. Screening for cervical cancer Patient is agreeable to procedure today. Advised patient to perform breast self-examination monthly during menstrual cycle. Visual inspection of external genitalia of vagina. No inguinal lymphadenopathy palpated. Vaginal walls and cervix appear pink during speculum examination. Cervical os visualized. Menstrual blood present during exam- attempted to clean blood from cervix with large cotton swab. Pap completed with no complications. Will update patient with results.  - Cytology - PAP  4. Breast lump on right side at 6 o'clock position Clinical breast exam completed with no abnormalities noted. No skin changes, nipple discharge, masses/lumps, or tenderness present. No enlarged axillary lymph nodes palpated. Small cyst palpated at 6 o'clock position without tenderness, skin changes, and erythema. Order for ultrasound of R breast placed. Will update patient with results.  - [place] LIMITED ULTRASOUND  INCLUDING AXILLA RIGHT BREAST; Future    NEXT PREVENTATIVE PHYSICAL DUE IN 1 YEAR.  Return in about 1 year (around 04/01/2025) for Physical with fasting labs.  Patient to reach out to office if new, worrisome, or unresolved symptoms arise or if no improvement in patient's condition. Patient verbalized understanding and is agreeable to treatment plan. All questions answered to patient's satisfaction.   Wilhelmena Hanson, FNP

## 2024-04-02 ENCOUNTER — Encounter: Payer: Self-pay | Admitting: Family Medicine

## 2024-04-02 LAB — COMPREHENSIVE METABOLIC PANEL WITH GFR
ALT: 22 IU/L (ref 0–32)
AST: 19 IU/L (ref 0–40)
Albumin: 4.1 g/dL (ref 4.0–5.0)
Alkaline Phosphatase: 95 IU/L (ref 44–121)
BUN/Creatinine Ratio: 15 (ref 9–23)
BUN: 12 mg/dL (ref 6–20)
Bilirubin Total: <0.2 mg/dL (ref 0.0–1.2)
CO2: 18 mmol/L — ABNORMAL LOW (ref 20–29)
Calcium: 9.3 mg/dL (ref 8.7–10.2)
Chloride: 108 mmol/L — ABNORMAL HIGH (ref 96–106)
Creatinine, Ser: 0.79 mg/dL (ref 0.57–1.00)
Globulin, Total: 2.9 g/dL (ref 1.5–4.5)
Glucose: 85 mg/dL (ref 70–99)
Potassium: 4.3 mmol/L (ref 3.5–5.2)
Sodium: 143 mmol/L (ref 134–144)
Total Protein: 7 g/dL (ref 6.0–8.5)
eGFR: 106 mL/min/{1.73_m2} (ref >59)

## 2024-04-05 LAB — CYTOLOGY - PAP: Diagnosis: NEGATIVE

## 2024-04-08 ENCOUNTER — Ambulatory Visit
Admission: RE | Admit: 2024-04-08 | Discharge: 2024-04-08 | Disposition: A | Discharge status: Home / Self Care | Source: Ambulatory Visit | Attending: Family Medicine | Admitting: Family Medicine

## 2024-04-08 DIAGNOSIS — N6315 Unspecified lump in the right breast, overlapping quadrants: Secondary | ICD-10-CM | POA: Insufficient documentation

## 2024-04-15 ENCOUNTER — Encounter: Payer: Self-pay | Admitting: Family Medicine

## 2024-04-16 ENCOUNTER — Other Ambulatory Visit: Payer: Self-pay | Admitting: Family Medicine

## 2024-04-16 DIAGNOSIS — Z803 Family history of malignant neoplasm of breast: Secondary | ICD-10-CM

## 2024-05-05 ENCOUNTER — Ambulatory Visit: Admitting: Dietician

## 2024-05-19 NOTE — Progress Notes (Deleted)
 REFERRING PROVIDER: Wilhelmena Hanson, FNP [address] [place],  [place] [address]  PRIMARY PROVIDER:  Wilhelmena Hanson, FNP  PRIMARY REASON FOR VISIT:  1. Family history of breast cancer      HISTORY OF PRESENT ILLNESS:   Julia Brooks, a 28 y.o. female, was seen for a [place] cancer genetics consultation at the request of Julia Hanson, FNP due to a family history of breaset cancer.  Julia Brooks presents to clinic today to discuss the possibility of a hereditary predisposition to cancer, genetic testing, and to further clarify her future cancer risks, as well as potential cancer risks for family members.   CANCER HISTORY:  Julia Brooks is a 28 y.o. female with no personal history of cancer.    RISK FACTORS:  Menarche was at age ***.  First live birth at age ***.  Ovaries intact: yes.  Hysterectomy: no.  Menopausal status: premenopausal.  HRT use: {Numbers 1-12 multi-select:20307} years. Colonoscopy: {Yes/No-Ex:120004}; {normal/abnormal/not examined:14677}. Mammogram within the last year: {Yes/No-Ex:120004}. Number of breast biopsies: {Numbers 1-12 multi-select:20307}.  Past Medical History:  Diagnosis Date   Asthma    Internal hemorrhoids    Migraine    Occasional tremors    Syncope and collapse     FAMILY HISTORY:  We obtained a detailed, 4-generation family history.  Significant diagnoses are listed below: Family History  Problem Relation Age of Onset   Hypertension Mother    Gestational diabetes Mother    Fibrocystic breast disease Mother        breast lump @ ?   Hypertension Father    Diabetes Mellitus II Father    Scoliosis Brother    Scoliosis Brother    Breast cancer Maternal Aunt        passed @ age 56    Julia Brooks is unaware of previous family history of genetic testing for hereditary cancer risks. There is no reported Ashkenazi Jewish ancestry. There is no known consanguinity.  GENETIC COUNSELING ASSESSMENT: Julia Brooks is a 28 y.o. female with a  family history of early onset breast cancer which is somewhat suggestive of a hereditary cancer syndrome and predisposition to cancer. We, therefore, discussed and recommended the following at today's visit.   DISCUSSION: We discussed that approximately 10% of breast cancer is hereditary. Most cases of hereditary breast cancer are associated with BRCA1/2 genes, although there are other genes associated with hereditary cancer as well. Cancers and risks are gene specific. We discussed that testing is beneficial for several reasons including knowing about cancer risks, identifying potential screening and risk-reduction options that may be appropriate, and to understand if other family members could be at risk for cancer and allow them to undergo genetic testing.   We reviewed the characteristics, features and inheritance patterns of hereditary cancer syndromes. We also discussed genetic testing, including the appropriate family members to test, the process of testing, insurance coverage and turn-around-time for results. We discussed the implications of a negative, positive and/or variant of uncertain significant result. We recommended Julia Brooks pursue genetic testing for the [facility] CancerNext+RNA gene panel.   The [facility] CancerNext+RNAinsight Panel includes sequencing, rearrangement analysis, and RNA analysis for the following 40 genes: APC, ATM, BAP1, BARD1, BMPR1A, BRCA1, BRCA2, BRIP1, CDH1, CDKN2A, CHEK2, FH, FLCN, MET, MLH1, MSH2, MSH6, MUTYH, NF1, NTHL1, PALB2, PMS2, PTEN, RAD51C, RAD51D, RPS20, SMAD4, STK11, TP53, TSC1, TSC2, and VHL (sequencing and deletion/duplication); AXIN2, HOXB13, MBD4, MSH3, POLD1 and POLE (sequencing only); EPCAM and GREM1 (deletion/duplication only).  Based on Julia Brooks's family  history of cancer, she meets medical criteria for genetic testing. Though Julia Brooks is not personally affected, there are no affected family members that are willing/able to undergo hereditary cancer  testing.  Therefore, Julia Brooks the most informative family member available. Despite that she meets criteria, she may still have an out of pocket cost.   We discussed that some people do not want to undergo genetic testing due to fear of genetic discrimination.  A federal law called the Genetic Information Non-Discrimination Act (GINA) of 2008 helps protect individuals against genetic discrimination based on their genetic test results.  It impacts both health insurance and employment.  For health insurance, it protects against increased premiums, being kicked off insurance or being forced to take a test in order to be insured.  For employment it protects against hiring, firing and promoting decisions based on genetic test results.  Health status due to a cancer diagnosis is not protected under GINA.  This law does not protect life insurance, disability insurance, or other types of insurance.   PLAN: After considering the risks, benefits, and limitations, Julia Brooks provided informed consent to pursue genetic testing and the blood sample was sent to [facility] for analysis of the CancerNext+RNA panel. Results should be available within approximately 2-3 weeks' time, at which point they will be disclosed by telephone to Julia Brooks, as will any additional recommendations warranted by these results. Julia Brooks will receive a summary of her genetic counseling visit and a copy of her results once available. This information will also be available in Epic.   *** Despite our recommendation, Julia Brooks did not wish to pursue genetic testing at today's visit. We understand this decision and remain available to coordinate genetic testing at any time in the future. We, therefore, recommend Julia Brooks continue to follow the cancer screening guidelines given by her primary healthcare provider.  ***Based on Julia Brooks's family history, we recommended her *** have genetic counseling and testing. [name] will let us  know if we can be of any assistance in coordinating genetic counseling and/or testing for this family member.   Julia Brooks questions were answered to her satisfaction today. Our contact information was provided should additional questions or concerns arise. Thank you for the referral and allowing us  to share in the care of your patient.   Julia Gee, MS, [facility] Genetic Counselor [email or web address] Phone: [phone]  *** minutes were spent on the date of the encounter in service to the patient including preparation, face-to-face consultation, documentation and care coordination. Dr. Nelson Bandy was available for discussion regarding this case.   _______________________________________________________________________ For Office Staff:  Number of people involved in session: *** Was an Intern/ student involved with case: no

## 2024-05-20 ENCOUNTER — Inpatient Hospital Stay

## 2024-05-20 ENCOUNTER — Inpatient Hospital Stay: Attending: Oncology | Admitting: Licensed Clinical Social Worker

## 2024-05-20 DIAGNOSIS — Z803 Family history of malignant neoplasm of breast: Secondary | ICD-10-CM

## 2024-07-07 ENCOUNTER — Ambulatory Visit: Payer: Self-pay

## 2024-07-07 NOTE — Telephone Encounter (Signed)
 FYI Only or Action Required?: FYI only for provider.  Patient was last seen in primary care on 04/01/2024 by Towana Small, FNP.  Called Nurse Triage reporting Abdominal Pain.  Symptoms began several weeks ago.  Interventions attempted: OTC medications: milk of magnesia.  Symptoms are: gradually worsening.  Triage Disposition: See Physician Within 24 Hours  Patient/caregiver understands and will follow disposition?:  Reason for Disposition  [1] MILD pain (e.g., does not interfere with normal activities) AND [2] comes and goes (cramps) AND [3] present > 72 hours  (Exception: This same abdominal pain is a chronic symptom recurrent or ongoing AND present > 4 weeks.)  Answer Assessment - Initial Assessment Questions Patient reports she has been taking milk of magnesia for her abdominal discomfort. Due to 2.5 week hx of diarrhea, this RN advised to d/c the MoM until she sees provider tomorrow. ED precautions reviewed, pt verbalized understanding. Encouraged patient to hydrate adequately.   1. LOCATION: Where does it hurt?      ULQ  2. RADIATION: Does the pain shoot anywhere else? (e.g., chest, back)     No  3. ONSET: When did the pain begin? (e.g., minutes, hours or days ago)      2.5 weeks ago  4. SUDDEN: Gradual or sudden onset?     Gradually worsening  5. PATTERN Does the pain come and go, or is it constant?     Intermittent  10. OTHER SYMPTOMS: Do you have any other symptoms? (e.g., back pain, diarrhea, fever, urination pain, vomiting)       Diarrhea described as watery and brown (denied hematochezia or melena) and weight loss.  11. PREGNANCY: Is there any chance you are pregnant? When was your last menstrual period?       Denies, not sexually active per patient  Protocols used: Abdominal Pain - Upper-A-AH Copied from CRM #[id number]. Topic: Clinical - Red Word Triage >> Jul 07, 2024  3:55 PM Tiffany S wrote: Red Word that prompted transfer to Nurse Triage:  Patient is having pain on her left side for the past 2 weeks loss appetite

## 2024-07-08 ENCOUNTER — Ambulatory Visit: Admitting: Family Medicine

## 2024-07-08 ENCOUNTER — Encounter: Payer: Self-pay | Admitting: Family Medicine

## 2024-07-08 VITALS — BP 126/84 | HR 70 | Resp 16 | Ht 60.63 in | Wt 197.2 lb

## 2024-07-08 DIAGNOSIS — R1012 Left upper quadrant pain: Secondary | ICD-10-CM | POA: Diagnosis not present

## 2024-07-08 DIAGNOSIS — N3001 Acute cystitis with hematuria: Secondary | ICD-10-CM

## 2024-07-08 LAB — POCT URINALYSIS DIPSTICK
Bilirubin, UA: NEGATIVE
Glucose, UA: NEGATIVE
Ketones, UA: 15
Nitrite, UA: NEGATIVE
Protein, UA: NEGATIVE
Spec Grav, UA: 1.025 (ref 1.010–1.025)
Urobilinogen, UA: 0.2 U/dL
pH, UA: 5.5 (ref 5.0–8.0)

## 2024-07-08 MED ORDER — NITROFURANTOIN MONOHYD MACRO 100 MG PO CAPS
100.0000 mg | ORAL_CAPSULE | Freq: Two times a day (BID) | ORAL | 0 refills | Status: AC
Start: 2024-07-08 — End: 2024-07-13

## 2024-07-08 NOTE — Patient Instructions (Addendum)
 When to seek emergency care or return to office: fever with rash, severe abdominal pain, intractable vomiting/vomiting blood, worsening pain, inability to pass stool/gas, rapid heart rate, dizziness.   MyChart:  For all urgent or time sensitive needs we ask that you please call the office to avoid delays. Our number is [phone]) [id number]. MyChart is not constantly monitored and due to the large volume of messages a day, replies may take up to 72 business hours.   MyChart Policy: MyChart allows for you to see your visit notes, after visit summary, provider recommendations, lab and tests results, make an appointment, request refills, and contact your provider or the office for non-urgent questions or concerns. Providers are seeing patients during normal business hours and do not have built in time to review MyChart messages.  We ask that you allow a minimum of 3 business days for responses to [employer]. For this reason, please do not send urgent requests through MyChart. Please call the office at [phone]. New and ongoing conditions may require a visit. We have virtual and in person visit available for your convenience.  Complex MyChart concerns may require a visit. Your provider may request you schedule a virtual or in person visit to ensure we are providing the best care possible. MyChart messages sent after 11:00 AM on Friday will not be received by the provider until Monday morning.    Lab and Test Results: You will receive your lab and test results on MyChart as soon as they are completed and results have been sent by the lab or testing facility. Due to this service, you will receive your results BEFORE your provider.  I review lab and tests results each morning prior to seeing patients. Some results require collaboration with other providers to ensure you are receiving the most appropriate care. For this reason, we ask that you please allow a minimum of 3-5 business days from the time the  ALL results have been received for your provider to receive and review lab and test results and contact you about these.  Most lab and test result comments from the provider will be sent through MyChart. Your provider may recommend changes to the plan of care, follow-up visits, repeat testing, ask questions, or request an office visit to discuss these results. You may reply directly to this message or call the office at [phone] to provide information for the provider or set up an appointment. In some instances, you will be called with test results and recommendations. Please let us  know if this is preferred and we will make note of this in your chart to provide this for you.    If you have not heard a response to your lab or test results in 5 business days from all results returning to MyChart, please call the office to let us  know. We ask that you please avoid calling prior to this time unless there is an emergent concern. Due to high call volumes, this can delay the resulting process.   After Hours: For all non-emergency after hours needs, please call the office at [phone] and select the option to reach the on-call provider service. On-call services are shared between multiple [facility] offices and therefore it will not be possible to speak directly with your provider. On-call providers may provide medical advice and recommendations, but are unable to provide refills for maintenance medications.  For all emergency or urgent medical needs after normal business hours, we recommend that you seek care at the closest Urgent  Care or Emergency Department to ensure appropriate treatment in a timely manner.  [facility] [place] has a 24 hour emergency room located on the ground floor for your convenience.    Urgent Concerns During the Business Day Providers are seeing patients from 8AM to 5PM, Monday through Thursday, and 8AM to 12PM on Friday with a busy schedule and are most often  not able to respond to non-urgent calls until the end of the day or the next business day. If you should have URGENT concerns during the day, please call and speak to the nurse or schedule a same day appointment so that we can address your concern without delay.    Thank you, again, for choosing me as your health care partner. I appreciate your trust and look forward to learning more about you.    Evalene Arts, FNP-C

## 2024-07-08 NOTE — Progress Notes (Signed)
 Acute Care Office Visit  Subjective:   Julia Brooks 03/08/1996 07/08/2024  Chief Complaint  Patient presents with   Abdominal Pain    LUQ Pain x 3 weeks worsening. Denies constipation. LMP 4 weeks ago   ABDOMINAL PAIN: Onset: acute x3 weeks  Location:  L upper quadrants  Description of pain: stabbing, constant  Radiation: to the upper back  Severity: 7/10 Alleviating factors: nothing  Aggravating factors:nothing  Now it is more constant   Reports diarrhea but thinks this normal for her.  Last week/week prior reports does not have an appetite.  Rash- last night or night before (random ones are normal d/t sensitive skin)  Reports very yellow.   Denies N/V, fever/chills, change in appetite, dysuria, urinary urgency/frequency, hematuria, syncopal episodes, unexplained weight loss. Pain associated with eating.   Symptoms:  Rash: yes  Weight loss: yes, since last OV  Decreased appetite: yes Melena/Hematochezia: no  BRBPR: yes, has hemorrhoids- was on toilet paper when she wiped  Heartburn: yes, normal  Hematemesis: no  Constipation: no History of sexually transmitted disease: no Recurrent NSAID use: no EtOH use: no  Vaginal bleeding: no  History of STI no  LMP: 4 weeks    The following portions of the patient's history were reviewed and updated as appropriate: past medical history, past surgical history, family history, social history, allergies, medications, and problem list.   Patient Active Problem List   Diagnosis Date Noted   Elevated LFTs 04/01/2024   Breast lump on right side at 6 o'clock position 04/01/2024   Near syncope 03/04/2014   Headache 03/04/2014   Past Medical History:  Diagnosis Date   Asthma    Internal hemorrhoids    Migraine    Occasional tremors    Syncope and collapse    History reviewed. No pertinent surgical history. Family History  Problem Relation Age of Onset   Hypertension Mother    Gestational diabetes Mother     Fibrocystic breast disease Mother        breast lump @ ?   Hypertension Father    Diabetes Mellitus II Father    Scoliosis Brother    Scoliosis Brother    Breast cancer Maternal Aunt        passed @ age 91   No outpatient medications prior to visit.   No facility-administered medications prior to visit.   No Known Allergies  ROS: A complete ROS was performed with pertinent positives/negatives noted in the HPI. The remainder of the ROS are negative.    Objective:   Today's Vitals   07/08/24 1545 07/08/24 1625  BP: 126/84   Pulse: (!) 105 70  Resp: 16   SpO2: 99%   Weight: 197 lb 3.2 oz (89.4 kg)   Height: 5' 0.63 (1.54 m)   PainSc: 7    PainLoc: Abdomen    Physical Exam Vitals reviewed.  Constitutional:      Appearance: She is well-developed.  Cardiovascular:     Rate and Rhythm: Normal rate and regular rhythm.  Pulmonary:     Effort: Pulmonary effort is normal.     Breath sounds: Normal breath sounds.  Abdominal:     General: Bowel sounds are normal. There is no distension or abdominal bruit.     Palpations: Abdomen is soft. There is no hepatomegaly, splenomegaly or mass.     Tenderness: There is abdominal tenderness in the left upper quadrant. There is left CVA tenderness. There is no right CVA  tenderness, guarding or rebound. Negative signs include Murphy's sign, Rovsing's sign, McBurney's sign and psoas sign.     Hernia: No hernia is present.  Neurological:     Mental Status: She is alert.  Psychiatric:        Mood and Affect: Mood normal.        Behavior: Behavior normal.         Assessment & Plan:   1. LUQ abdominal pain (Primary) Patient is a pleasant 28 year old female patient who presents today for LUQ abdominal pain and urinary symptoms. She is well-appearing and in no acute distress. Vital signs stable- initial tachycardia, most likely related to anxiety. Physical exam benign. No tenderness to palpation, rebound tenderness or guarding present.  Advised patient that further work-up could involve imaging. Patient is agreeable to plan of care. Counseled regarding ED precautions and when to return to office.  Will treat for acute UTI. Advised patient to reach out if symptoms persist or worsen.  - nitrofurantoin , macrocrystal-monohydrate, (MACROBID ) 100 MG capsule; Take 1 capsule (100 mg total) by mouth 2 (two) times daily for 5 days.  Dispense: 10 capsule; Refill: 0 - Urine Culture - POCT Urinalysis Dipstick  2. Acute cystitis with hematuria POCT UA performed in office with small amount of leukocytes and trace blood. Will treat with macrobid  and send for urine culture. Will update patient with results of urine culture.  - nitrofurantoin , macrocrystal-monohydrate, (MACROBID ) 100 MG capsule; Take 1 capsule (100 mg total) by mouth 2 (two) times daily for 5 days.  Dispense: 10 capsule; Refill: 0 - Urine Culture   Return if symptoms worsen or fail to improve.    Patient to reach out to office if new, worrisome, or unresolved symptoms arise or if no improvement in patient's condition. Patient verbalized understanding and is agreeable to treatment plan. All questions answered to patient's satisfaction.    Evalene Arts, FNP

## 2024-07-11 LAB — URINE CULTURE

## 2024-07-19 ENCOUNTER — Ambulatory Visit: Payer: Self-pay | Admitting: Family Medicine

## 2024-07-20 ENCOUNTER — Encounter: Payer: Self-pay | Admitting: Family Medicine

## 2024-07-28 ENCOUNTER — Other Ambulatory Visit: Payer: Self-pay | Admitting: Family Medicine

## 2024-07-28 DIAGNOSIS — R7401 Elevation of levels of liver transaminase levels: Secondary | ICD-10-CM

## 2024-07-28 DIAGNOSIS — R1084 Generalized abdominal pain: Secondary | ICD-10-CM

## 2024-11-29 ENCOUNTER — Telehealth: Payer: Self-pay | Admitting: Family Medicine

## 2024-11-29 NOTE — Telephone Encounter (Signed)
 Copied from CRM #[id number]. Topic: Clinical - Red Word Triage >> Nov 29, 2024  8:39 AM Olam RAMAN wrote: Red Word that prompted transfer to Nurse Triage: Pt is ill and hasnt gotten better, sore throat and congested/pain PT REFUSED nt

## 2024-11-30 ENCOUNTER — Encounter: Payer: Self-pay | Admitting: Family Medicine

## 2024-11-30 ENCOUNTER — Ambulatory Visit: Admitting: Family Medicine

## 2024-11-30 VITALS — BP 113/80 | HR 94 | Temp 98.6°F | Resp 16 | Ht 60.63 in | Wt 198.0 lb

## 2024-11-30 DIAGNOSIS — J069 Acute upper respiratory infection, unspecified: Secondary | ICD-10-CM

## 2024-11-30 MED ORDER — HYDROCODONE BIT-HOMATROP MBR 5-1.5 MG/5ML PO SOLN: 5.0000 mL | Freq: Three times a day (TID) | ORAL | 0 refills | Status: AC | PRN

## 2024-11-30 MED ORDER — AMOXICILLIN-POT CLAVULANATE 875-125 MG PO TABS: 1.0000 | ORAL_TABLET | Freq: Two times a day (BID) | ORAL | 0 refills | Status: AC

## 2024-11-30 NOTE — Progress Notes (Signed)
 Acute Care Office Visit  Subjective:   Julia Brooks 1996-04-06 11/30/2024  Chief Complaint  Patient presents with   Sore Throat    1 week and half ago started feeling sick may have had fever. Now loss of voice and headache.    HPI: Discussed the use of AI scribe software for clinical note transcription with the patient, who gave verbal consent to proceed.  History of Present Illness   Julia Brooks is a 28 year old female who presents with sore throat, congestion, and cough for the past week and a half.  She has been experiencing sore throat and congestion for the past week to week and a half, with symptoms fluctuating throughout the day. Symptoms fluctuate throughout the day, with the patient reporting she is worse in the morning and at night, and some relief during the middle of the day.  The sore throat began approximately four to five days ago, and she developed a productive cough with green mucus two to three days ago. No shortness of breath or wheezing. She does notice significant nasal congestion. She has experienced headaches, particularly today, and reports that her sore throat is not currently bothersome unless she coughs, which then causes pain.  No sinus pain or pressure, ear pain, or feeling of being underwater. 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  She has been using over-the-counter medications, including Advil  for headaches and NyQuil for cold and flu symptoms, but reports that these have not been effective. She has gone through a whole box of NyQuil without relief.  She experienced fevers and chills at the onset of her symptoms but not recently.      The following portions of the patient's history were reviewed and updated as appropriate: past medical history, past surgical history, family history, social history, allergies, medications, and problem list.   Patient  Active Problem List   Diagnosis Date Noted   Elevated LFTs 04/01/2024   Breast lump on right side at 6 o'clock position 04/01/2024   Near syncope 03/04/2014   Headache 03/04/2014   Past Medical History:  Diagnosis Date   Asthma    Internal hemorrhoids    Migraine    Occasional tremors    Syncope and collapse    History reviewed. No pertinent surgical history. Family History  Problem Relation Age of Onset   Hypertension Mother    Gestational diabetes Mother    Fibrocystic breast disease Mother        breast lump @ ?   Hypertension Father    Diabetes Mellitus II Father    Scoliosis Brother    Scoliosis Brother    Breast cancer Maternal Aunt        passed @ age 82   No outpatient medications prior to visit.   No facility-administered medications prior to visit.   Allergies[1]  ROS: A complete ROS was performed with pertinent positives/negatives noted in the HPI. The remainder of the ROS are negative.    Objective:   Today's Vitals   11/30/24 1106  BP: 113/80  Pulse: 94  Resp: 16  Temp: 98.6 F (37 C)  TempSrc: Oral  SpO2: 96%  Weight: 198 lb (89.8 kg)  Height: 5' 0.63 (1.54 m)  PainSc: 6    Physical Exam Vitals reviewed.  Constitutional:      Appearance: She is well-developed.  HENT:  Right Ear: Ear canal normal. No decreased hearing noted. No tenderness. No middle ear effusion. No hemotympanum. Tympanic membrane is bulging. Tympanic membrane is not injected or erythematous.     Left Ear: Ear canal normal. No decreased hearing noted. No tenderness.  No middle ear effusion. No hemotympanum. Tympanic membrane is bulging. Tympanic membrane is not injected or erythematous.     Nose: Congestion and rhinorrhea present.     Right Turbinates: Enlarged and swollen.     Left Turbinates: Enlarged and swollen.     Right Sinus: No maxillary sinus tenderness or frontal sinus tenderness.     Left Sinus: No maxillary sinus tenderness or frontal sinus tenderness.      Mouth/Throat:     Pharynx: Oropharynx is clear. Uvula midline. Postnasal drip present. No pharyngeal swelling, oropharyngeal exudate, posterior oropharyngeal erythema or uvula swelling.     Tonsils: No tonsillar exudate or tonsillar abscesses.  Cardiovascular:     Rate and Rhythm: Normal rate and regular rhythm.     Pulses: Normal pulses.     Heart sounds: Normal heart sounds, S1 normal and S2 normal.  Pulmonary:     Effort: Pulmonary effort is normal.     Breath sounds: Normal breath sounds and air entry.  Lymphadenopathy:     Cervical: No cervical adenopathy.  Neurological:     Mental Status: She is alert.  Psychiatric:        Mood and Affect: Mood normal.        Behavior: Behavior normal.      Assessment & Plan:   1. Acute URI (Primary) Acute upper respiratory infection with sinusitis. HENT exam shows bilateral TM bulging without infection, effusion or erythema. Nasal turbinates erythematous and swollen with visible mucous present. No tenderness to palpation in either the frontal or maxillary sinuses. Lungs clear to auscultation in all lung fields. Symptoms suggest progression from viral to bacterial infection, likely bacterial sinusitis. Prescribed Augmentin  twice daily for 7 days. Prescribed cough medication with codeine for nighttime use. Advised improvement expected in 2-3 days. Discussed taking Tylenol /Advil  for headache, sore throat or fever/chills.  - HYDROcodone  bit-homatropine (HYCODAN) 5-1.5 MG/5ML syrup; Take 5 mLs by mouth every 8 (eight) hours as needed for cough.  Dispense: 120 mL; Refill: 0 - amoxicillin -clavulanate (AUGMENTIN ) 875-125 MG tablet; Take 1 tablet by mouth 2 (two) times daily for 7 days.  Dispense: 14 tablet; Refill: 0    Return if symptoms worsen or fail to improve.   Patient to reach out to office if new, worrisome, or unresolved symptoms arise or if no improvement in patient's condition. Patient verbalized understanding and is agreeable to treatment plan.  All questions answered to patient's satisfaction.    Evalene Arts, FNP      [1] No Known Allergies

## 2024-12-17 ENCOUNTER — Ambulatory Visit: Payer: Self-pay

## 2024-12-17 ENCOUNTER — Ambulatory Visit
Admission: EM | Admit: 2024-12-17 | Discharge: 2024-12-17 | Disposition: A | Discharge status: Home / Self Care | Attending: Emergency Medicine | Admitting: Emergency Medicine

## 2024-12-17 DIAGNOSIS — Z20828 Contact with and (suspected) exposure to other viral communicable diseases: Secondary | ICD-10-CM

## 2024-12-17 DIAGNOSIS — R52 Pain, unspecified: Secondary | ICD-10-CM

## 2024-12-17 DIAGNOSIS — R6889 Other general symptoms and signs: Secondary | ICD-10-CM

## 2024-12-17 DIAGNOSIS — J069 Acute upper respiratory infection, unspecified: Secondary | ICD-10-CM

## 2024-12-17 DIAGNOSIS — R6883 Chills (without fever): Secondary | ICD-10-CM

## 2024-12-17 LAB — POCT INFLUENZA A/B
Influenza A, POC: NEGATIVE
Influenza B, POC: NEGATIVE

## 2024-12-17 LAB — POC SOFIA SARS ANTIGEN FIA: SARS Coronavirus 2 Ag: NEGATIVE

## 2024-12-17 NOTE — ED Provider Notes (Signed)
 "    [facility]    CSN: [id number] Arrival date & time: 12/17/24  1235    HISTORY   Chief Complaint  Patient presents with   Headache   Generalized Body Aches   Chills   Shortness of Breath   HPI Julia Brooks is a pleasant, 29 y.o. female who presents to urgent care today. Patient presents today with c/o: headache, body aches, SHOB, nasal congestion, rhinorrhea and chills.  Patient has a slightly elevated heart rate on arrival with otherwise normal vital signs.  Per my observation, patient appears to be in no acute distress.  Patient denies fever, cough, otalgia, sore throat, nausea, vomiting, diarrhea.  Patient states many of her family members have been sick with influenza since December 25.  Patient states she has not tried any medications or home remedies to alleviate her symptoms.  The history is provided by the patient.  Headache Shortness of Breath Associated symptoms: headaches    Past Medical History:  Diagnosis Date   Asthma    Internal hemorrhoids    Migraine    Occasional tremors    Syncope and collapse    Patient Active Problem List   Diagnosis Date Noted   Elevated LFTs 04/01/2024   Breast lump on right side at 6 o'clock position 04/01/2024   Near syncope 03/04/2014   Headache 03/04/2014   History reviewed. No pertinent surgical history. OB History   No obstetric history on file.    Home Medications    Prior to Admission medications  Medication Sig Start Date End Date Taking? Authorizing Provider  HYDROcodone  bit-homatropine (HYCODAN) 5-1.5 MG/5ML syrup Take 5 mLs by mouth every 8 (eight) hours as needed for cough. 11/30/24   Towana Small, FNP    Family History Family History  Problem Relation Age of Onset   Hypertension Mother    Gestational diabetes Mother    Fibrocystic breast disease Mother        breast lump @ ?   Hypertension Father    Diabetes Mellitus II Father    Scoliosis Brother    Scoliosis Brother    Breast  cancer Maternal Aunt        passed @ age 21   Social History Social History[1] Allergies   Patient has no known allergies.  Review of Systems Review of Systems  Respiratory:  Positive for shortness of breath.   Neurological:  Positive for headaches.   Pertinent findings revealed after performing a 14 point review of systems has been noted in the history of present illness.  Physical Exam Vital Signs BP 131/83 (BP Location: Right Arm)   Pulse (!) 107   Temp 98.1 F (36.7 C) (Oral)   Resp 20   SpO2 96%   No data found.  Physical Exam Vitals and nursing note reviewed.  Constitutional:      General: She is awake. She is not in acute distress.    Appearance: Normal appearance. She is well-developed and well-groomed. She is not ill-appearing.  HENT:     Head: Normocephalic and atraumatic.     Salivary Glands: Right salivary gland is not diffusely enlarged or tender. Left salivary gland is not diffusely enlarged or tender.     Right Ear: Hearing, tympanic membrane, ear canal and external ear normal.     Left Ear: Hearing, tympanic membrane, ear canal and external ear normal.     Nose: Nose normal.     Right Turbinates: Not enlarged, swollen or pale.  Left Turbinates: Not enlarged, swollen or pale.     Right Sinus: No maxillary sinus tenderness or frontal sinus tenderness.     Left Sinus: No maxillary sinus tenderness or frontal sinus tenderness.     Mouth/Throat:     Lips: Pink. No lesions.     Mouth: Mucous membranes are moist. No oral lesions.     Tongue: No lesions. Tongue does not deviate from midline.     Palate: No mass and lesions.     Pharynx: Oropharynx is clear. Uvula midline. No pharyngeal swelling, oropharyngeal exudate, posterior oropharyngeal erythema, uvula swelling or postnasal drip.     Tonsils: No tonsillar exudate. 0 on the right. 0 on the left.  Eyes:     General: Lids are normal.        Right eye: No discharge.        Left eye: No discharge.      Conjunctiva/sclera: Conjunctivae normal.     Right eye: Right conjunctiva is not injected.     Left eye: Left conjunctiva is not injected.  Neck:     Trachea: Trachea and phonation normal.  Cardiovascular:     Rate and Rhythm: Normal rate and regular rhythm.  Pulmonary:     Effort: Pulmonary effort is normal.     Breath sounds: Normal breath sounds.  Chest:     Chest wall: No tenderness.  Musculoskeletal:        General: Normal range of motion.     Cervical back: Full passive range of motion without pain, normal range of motion and neck supple. Normal range of motion.  Lymphadenopathy:     Cervical: No cervical adenopathy.  Skin:    General: Skin is warm and dry.     Findings: No erythema or rash.  Neurological:     General: No focal deficit present.     Mental Status: She is alert and oriented to person, place, and time. Mental status is at baseline.  Psychiatric:        Attention and Perception: Attention and perception normal.        Mood and Affect: Mood and affect normal.        Speech: Speech normal.        Behavior: Behavior normal. Behavior is cooperative.        Thought Content: Thought content normal.     Visual Acuity Right Eye Distance:   Left Eye Distance:   Bilateral Distance:    Right Eye Near:   Left Eye Near:    Bilateral Near:     [facility] Couse / Diagnostics / Procedures:     Radiology No results found.  Procedures Procedures (including critical care time) EKG  Pending results:  Labs Reviewed  POCT INFLUENZA A/B  POC SOFIA SARS ANTIGEN FIA    Medications Ordered in UC: Medications - No data to display  UC Diagnoses / Final Clinical Impressions(s)   I have reviewed the triage vital signs and the nursing notes.  Pertinent labs & imaging results that were available during my care of the patient were reviewed by me and considered in my medical decision making (see chart for details).    Final diagnoses:  Feeling unwell  Influenza-like  symptoms  Exposure to influenza  Viral URI  Body aches  Chills   Patient advised of negative COVID-19 and influenza tests today.  Recommend repeat COVID-19 testing in 3 days if feeling no better.  Physical exam findings today are unremarkable.  Conservative care recommended.  Supportive medications discussed.  Return precautions advised.  Please see discharge instructions below for details of plan of care as provided to patient. ED Prescriptions   None    PDMP not reviewed this encounter.    Discharge Instructions      Your rapid influenza and COVID-19 antigen test today was negative.  No further influenza testing is indicated.  Please consider retesting for COVID-19 in the next 2 to 3 days, particularly if you are not feeling any better.  You are welcome to return here to urgent care to repeat the test or you can take a home COVID-19 test.     If both your COVID-19 tests are negative, then you can safely assume that your illness is due to one of the many less serious illnesses circulating in our community right now.     Conservative care is recommended with rest, drinking plenty of clear fluids, eating only when hungry, taking supportive medications for your symptoms and avoiding being around other people.  Please remain at home until you are fever free for 24 hours without the use of antifever medications such as Tylenol  and ibuprofen .   Please read below to learn more about the medications, dosages and frequencies that I recommend to help alleviate your symptoms and to get you feeling better soon:   Advil , Motrin  (ibuprofen ): This is a good anti-inflammatory medication which addresses aches, pains and inflammation of the upper airways that causes sinus and nasal congestion as well as in the lower airways which makes your cough feel tight and sometimes burn.  I recommend that you take  400 mg every 6-8 hours as needed.  Please do not take more than 2400 mg of ibuprofen  in a 24-hour  period and please do not take high doses of ibuprofen  for more than 3 days in a row as this can lead to stomach ulcers.   DayQuil Severe Cold and Flu Liquid / Mucinex Max Strength Cold and Flu Liquidgels: These are identical over-the-counter multisymptom medications that contain a fever/pain reliever, acetaminophen  650 mg, a cough suppressant, dextromethorphan 20 mg, an expectorant which loosens congestion to make coughing easier, guaifenesin 400 mg, and a decongestant intended to stop mucous production, phenylephrine 10 mg.  Please keep in mind that the FDA recently stated that phenylephrine is ineffective.  My personal recommendation is to avoid combination medications such as this one and to instead choose your own combination of the single symptom relievers listed in this summary.  Please be careful when taking this medication along with other medications containing acetaminophen  or Tylenol .  The maximum dose of Tylenol  in a 24-hour period is 3000 mg, taking more than 3000 mg can damage your liver.   NyQuil Severe Cold and Flu Liquid, NyQuil VapoCOOL Caplets / Tylenol  Cold + Flu + Cough Nighttime Liquid: These are identical over-the-counter multisymptom medications that contain a fever/pain reliever, acetaminophen  650 mg, a cough suppressant, dextromethorphan 30 mg, and an antihistamine commonly found and sleep medications such as Unisom and Sleep-Aid that dries out mucus production and helps you sleep, doxylamine 12.5 mg.  Please do not take this medication along with other cough suppressants with the letters DM or with other antihistamines such as Benadryl, Zyrtec, Xyzal, Allegra or Claritin to avoid overdose.  Please be careful when taking this medication along with other medications containing acetaminophen  or Tylenol .  The maximum dose of Tylenol  in a 24-hour period is 3000 mg, taking more than 3000 mg can damage your liver.   If symptoms  have not meaningfully improved in the next 5 to 7 days,  please return for repeat evaluation or follow-up with your regular provider.  If symptoms have worsened in the next 3 to 5 days, please go to the emergency room for further evaluation.    Thank you for visiting urgent care today.  We appreciate the opportunity to participate in your care.       Disposition Upon Discharge:  Condition: stable for discharge home  Patient presented with an acute illness with associated systemic symptoms and significant discomfort requiring urgent management. In my opinion, this is a condition that a prudent lay person (someone who possesses an average knowledge of health and medicine) may potentially expect to result in complications if not addressed urgently such as respiratory distress, impairment of bodily function or dysfunction of bodily organs.   Routine symptom specific, illness specific and/or disease specific instructions were discussed with the patient and/or caregiver at length.   As such, the patient has been evaluated and assessed, work-up was performed and treatment was provided in alignment with urgent care protocols and evidence based medicine.  Patient/parent/caregiver has been advised that the patient may require follow up for further testing and treatment if the symptoms continue in spite of treatment, as clinically indicated and appropriate.  Patient/parent/caregiver has been advised to return to the [facility] or PCP if no better; to PCP or the Emergency Department if new signs and symptoms develop, or if the current signs or symptoms continue to change or worsen for further workup, evaluation and treatment as clinically indicated and appropriate  The patient will follow up with their current PCP if and as advised. If the patient does not currently have a PCP we will assist them in obtaining one.   The patient may need specialty follow up if the symptoms continue, in spite of conservative treatment and management, for further workup, evaluation,  consultation and treatment as clinically indicated and appropriate.  Patient/parent/caregiver verbalized understanding and agreement of plan as discussed.  All questions were addressed during visit.  Please see discharge instructions below for further details of plan.  This office note has been dictated using Teaching laboratory technician.  Unfortunately, this method of dictation can sometimes lead to typographical or grammatical errors.  I apologize for your inconvenience in advance if this occurs.  Please do not hesitate to reach out to me if clarification is needed.       [1]  Social History Tobacco Use   Smoking status: Former    Current packs/day: 0.00    Average packs/day: 0.5 packs/day for 4.0 years (2.0 ttl pk-yrs)    Types: Cigarettes    Start date: 2016    Quit date: 2020    Years since quitting: 6.0    Passive exposure: Current   Smokeless tobacco: Never  Vaping Use   Vaping status: Every Day   Substances: Nicotine  Substance Use Topics   Alcohol use: No   Drug use: No     Joesph Shaver Scales, PA-C 12/17/24 1322  "

## 2024-12-17 NOTE — Telephone Encounter (Signed)
 FYI Only or Action Required?: FYI only for provider: [facility].  Patient was last seen in primary care on 11/30/2024 by Towana Small, FNP.  Called Nurse Triage reporting Influenza.  Symptoms began today.  Interventions attempted: Nothing.  Symptoms are: stable.  Triage Disposition: Home Care  Patient/caregiver understands and will follow disposition?: Yes  Copied from CRM [id number]. Topic: Clinical - Red Word Triage >> Dec 17, 2024 11:19 AM Ivette P wrote: Red Word that prompted transfer to Nurse Triage: might flu - entire body hurts - head is pounding. father jsut tested postive  chills all morning  symtpoms just started today, getting ahead of it. - feels like needs to go to urgent care. Reason for Disposition  [1] Probable influenza (fever) with no complications AND [2] NOT HIGH RISK  Answer Assessment - Initial Assessment Questions 1. SYMPTOMS: What is your main symptom or concern? (e.g., cough, fever, shortness of breath, muscle aches)      Chills, feeling of being hit by a bus  2. ONSET: When did the symptoms start?      This morning   3. COUGH: Do you have a cough? If Yes, ask: How bad is the cough?        Denies  4. FEVER: Do you have a fever? If Yes, ask: What is your temperature, how was it measured, and when did it start?     Denies  5. BREATHING DIFFICULTY: Are you having any difficulty breathing? (e.g., normal; shortness of breath, wheezing, unable to speak)      Denies  6. BETTER-SAME-WORSE: Are you getting better, staying the same or getting worse compared to yesterday?  If getting worse, ask, In what way?     New onset  7. OTHER SYMPTOMS: Do you have any other symptoms?  (e.g., chills, fatigue, headache, loss of smell or taste, muscle pain, sore throat)     Fatigue and body aches, headache, chest pain  8. INFLUENZA EXPOSURE: Was there any known exposure to influenza (flu) before the symptoms began?       Yes, father tested  positive  9. INFLUENZA SUSPECTED: Why do you think you have influenza? (e.g., positive flu self-test at home, symptoms after exposure).      Father tested positive and has been around pt  10. INFLUENZA VACCINE: Have you had the flu vaccine? If Yes, ask: When did you last get it?        Denies  11. HIGH RISK FOR COMPLICATIONS: Do you have any chronic medical problems? (e.g., asthma, heart or lung disease, obesity, weak immune system)   Per pt's chart, pt does not have high risk complications   Pt reports flulike s/sx Pt advised to go to UC for evaluation and treatment due to lack of availability in clinic  Pt agrees with plan of care, will call back for any worsening symptoms  Protocols used: Influenza (Flu) Suspected-A-AH

## 2024-12-17 NOTE — Discharge Instructions (Signed)
 Your rapid influenza and COVID-19 antigen test today was negative.  No further influenza testing is indicated.  Please consider retesting for COVID-19 in the next 2 to 3 days, particularly if you are not feeling any better.  You are welcome to return here to urgent care to repeat the test or you can take a home COVID-19 test.     If both your COVID-19 tests are negative, then you can safely assume that your illness is due to one of the many less serious illnesses circulating in our community right now.     Conservative care is recommended with rest, drinking plenty of clear fluids, eating only when hungry, taking supportive medications for your symptoms and avoiding being around other people.  Please remain at home until you are fever free for 24 hours without the use of antifever medications such as Tylenol and ibuprofen.   Please read below to learn more about the medications, dosages and frequencies that I recommend to help alleviate your symptoms and to get you feeling better soon:   Advil, Motrin (ibuprofen): This is a good anti-inflammatory medication which addresses aches, pains and inflammation of the upper airways that causes sinus and nasal congestion as well as in the lower airways which makes your cough feel tight and sometimes burn.  I recommend that you take  400 mg every 6-8 hours as needed.  Please do not take more than 2400 mg of ibuprofen in a 24-hour period and please do not take high doses of ibuprofen for more than 3 days in a row as this can lead to stomach ulcers.   DayQuil Severe Cold and Flu Liquid / Mucinex Max Strength Cold and Flu Liquidgels: These are identical over-the-counter multisymptom medications that contain a fever/pain reliever, acetaminophen 650 mg, a cough suppressant, dextromethorphan 20 mg, an expectorant which loosens congestion to make coughing easier, guaifenesin 400 mg, and a decongestant intended to stop mucous production, phenylephrine 10 mg.  Please keep in  mind that the FDA recently stated that phenylephrine is ineffective.  My personal recommendation is to avoid combination medications such as this one and to instead choose your own combination of the single symptom relievers listed in this summary.  Please be careful when taking this medication along with other medications containing acetaminophen or Tylenol.  The maximum dose of Tylenol in a 24-hour period is 3000 mg, taking more than 3000 mg can damage your liver.   NyQuil Severe Cold and Flu Liquid, NyQuil VapoCOOL Caplets / Tylenol Cold + Flu + Cough Nighttime Liquid: These are identical over-the-counter multisymptom medications that contain a fever/pain reliever, acetaminophen 650 mg, a cough suppressant, dextromethorphan 30 mg, and an antihistamine commonly found and sleep medications such as Unisom and Sleep-Aid that dries out mucus production and helps you sleep, doxylamine 12.5 mg.  Please do not take this medication along with other cough suppressants with the letters DM or with other antihistamines such as Benadryl, Zyrtec, Xyzal, Allegra or Claritin to avoid overdose.  Please be careful when taking this medication along with other medications containing acetaminophen or Tylenol.  The maximum dose of Tylenol in a 24-hour period is 3000 mg, taking more than 3000 mg can damage your liver.        If symptoms have not meaningfully improved in the next 5 to 7 days, please return for repeat evaluation or follow-up with your regular provider.  If symptoms have worsened in the next 3 to 5 days, please go to the emergency room for  further evaluation.    Thank you for visiting urgent care today.  We appreciate the opportunity to participate in your care.

## 2024-12-17 NOTE — ED Triage Notes (Signed)
 Patient presents today with c/o: headache, body aches, SHOB, congestion and chills. Patient does not display physical signs/ symptoms of respiratory distress at this time.   Started: last night   Home interventions: none

## 2025-03-28 ENCOUNTER — Ambulatory Visit

## 2025-04-04 ENCOUNTER — Encounter: Admitting: Family Medicine
